# Patient Record
Sex: Male | Born: 1980 | Race: White | Hispanic: Yes | Marital: Single | Smoking: Never smoker
Health system: Southern US, Community
[De-identification: ages and names within clinical notes are randomized; demographics above are authoritative.]

## PROBLEM LIST (undated history)

## (undated) DIAGNOSIS — E119 Type 2 diabetes mellitus without complications: Secondary | ICD-10-CM

## (undated) DIAGNOSIS — H9190 Unspecified hearing loss, unspecified ear: Secondary | ICD-10-CM

## (undated) DIAGNOSIS — K589 Irritable bowel syndrome without diarrhea: Secondary | ICD-10-CM

## (undated) HISTORY — DX: Type 2 diabetes mellitus without complications: E11.9

## (undated) HISTORY — DX: Unspecified hearing loss, unspecified ear: H91.90

## (undated) HISTORY — DX: Irritable bowel syndrome, unspecified: K58.9

---

## 2008-05-19 DIAGNOSIS — H93019 Transient ischemic deafness, unspecified ear: Secondary | ICD-10-CM | POA: Insufficient documentation

## 2011-05-04 DIAGNOSIS — J029 Acute pharyngitis, unspecified: Secondary | ICD-10-CM | POA: Diagnosis not present

## 2011-07-13 DIAGNOSIS — R5381 Other malaise: Secondary | ICD-10-CM | POA: Diagnosis not present

## 2011-07-13 DIAGNOSIS — R5383 Other fatigue: Secondary | ICD-10-CM | POA: Diagnosis not present

## 2011-07-13 DIAGNOSIS — J029 Acute pharyngitis, unspecified: Secondary | ICD-10-CM | POA: Diagnosis not present

## 2011-07-15 DIAGNOSIS — R5383 Other fatigue: Secondary | ICD-10-CM | POA: Diagnosis not present

## 2011-07-15 DIAGNOSIS — Z6828 Body mass index (BMI) 28.0-28.9, adult: Secondary | ICD-10-CM | POA: Diagnosis not present

## 2011-07-15 DIAGNOSIS — K589 Irritable bowel syndrome without diarrhea: Secondary | ICD-10-CM | POA: Diagnosis not present

## 2011-07-15 DIAGNOSIS — R5381 Other malaise: Secondary | ICD-10-CM | POA: Diagnosis not present

## 2011-07-15 DIAGNOSIS — F489 Nonpsychotic mental disorder, unspecified: Secondary | ICD-10-CM | POA: Diagnosis not present

## 2011-07-18 DIAGNOSIS — Z6829 Body mass index (BMI) 29.0-29.9, adult: Secondary | ICD-10-CM | POA: Diagnosis not present

## 2011-07-18 DIAGNOSIS — R1115 Cyclical vomiting syndrome unrelated to migraine: Secondary | ICD-10-CM | POA: Diagnosis not present

## 2011-08-24 DIAGNOSIS — K649 Unspecified hemorrhoids: Secondary | ICD-10-CM | POA: Diagnosis not present

## 2011-09-30 DIAGNOSIS — K589 Irritable bowel syndrome without diarrhea: Secondary | ICD-10-CM | POA: Diagnosis not present

## 2012-04-21 DIAGNOSIS — R109 Unspecified abdominal pain: Secondary | ICD-10-CM | POA: Diagnosis not present

## 2012-04-21 DIAGNOSIS — Z79899 Other long term (current) drug therapy: Secondary | ICD-10-CM | POA: Diagnosis not present

## 2012-04-21 DIAGNOSIS — H9190 Unspecified hearing loss, unspecified ear: Secondary | ICD-10-CM | POA: Diagnosis not present

## 2012-04-21 DIAGNOSIS — R5383 Other fatigue: Secondary | ICD-10-CM | POA: Diagnosis not present

## 2012-04-21 DIAGNOSIS — K625 Hemorrhage of anus and rectum: Secondary | ICD-10-CM | POA: Diagnosis not present

## 2012-04-21 DIAGNOSIS — K589 Irritable bowel syndrome without diarrhea: Secondary | ICD-10-CM | POA: Diagnosis not present

## 2012-04-21 DIAGNOSIS — R197 Diarrhea, unspecified: Secondary | ICD-10-CM | POA: Diagnosis not present

## 2012-04-21 DIAGNOSIS — F329 Major depressive disorder, single episode, unspecified: Secondary | ICD-10-CM | POA: Diagnosis not present

## 2012-04-21 DIAGNOSIS — R42 Dizziness and giddiness: Secondary | ICD-10-CM | POA: Diagnosis not present

## 2012-04-21 DIAGNOSIS — R5381 Other malaise: Secondary | ICD-10-CM | POA: Diagnosis not present

## 2012-04-21 DIAGNOSIS — K219 Gastro-esophageal reflux disease without esophagitis: Secondary | ICD-10-CM | POA: Diagnosis not present

## 2012-04-27 DIAGNOSIS — K625 Hemorrhage of anus and rectum: Secondary | ICD-10-CM | POA: Diagnosis not present

## 2012-04-27 DIAGNOSIS — Z6830 Body mass index (BMI) 30.0-30.9, adult: Secondary | ICD-10-CM | POA: Diagnosis not present

## 2012-04-27 DIAGNOSIS — K589 Irritable bowel syndrome without diarrhea: Secondary | ICD-10-CM | POA: Diagnosis not present

## 2012-04-27 DIAGNOSIS — F489 Nonpsychotic mental disorder, unspecified: Secondary | ICD-10-CM | POA: Diagnosis not present

## 2012-07-10 DIAGNOSIS — R197 Diarrhea, unspecified: Secondary | ICD-10-CM | POA: Diagnosis not present

## 2012-07-16 DIAGNOSIS — K589 Irritable bowel syndrome without diarrhea: Secondary | ICD-10-CM | POA: Diagnosis not present

## 2012-08-21 DIAGNOSIS — R197 Diarrhea, unspecified: Secondary | ICD-10-CM | POA: Diagnosis not present

## 2012-08-21 DIAGNOSIS — K648 Other hemorrhoids: Secondary | ICD-10-CM | POA: Diagnosis not present

## 2012-08-21 DIAGNOSIS — K921 Melena: Secondary | ICD-10-CM | POA: Diagnosis not present

## 2012-08-21 DIAGNOSIS — K6289 Other specified diseases of anus and rectum: Secondary | ICD-10-CM | POA: Diagnosis not present

## 2012-08-21 DIAGNOSIS — K649 Unspecified hemorrhoids: Secondary | ICD-10-CM | POA: Diagnosis not present

## 2012-10-29 DIAGNOSIS — E038 Other specified hypothyroidism: Secondary | ICD-10-CM | POA: Diagnosis not present

## 2012-10-29 DIAGNOSIS — F489 Nonpsychotic mental disorder, unspecified: Secondary | ICD-10-CM | POA: Diagnosis not present

## 2012-10-29 DIAGNOSIS — R1013 Epigastric pain: Secondary | ICD-10-CM | POA: Diagnosis not present

## 2012-11-01 DIAGNOSIS — R1011 Right upper quadrant pain: Secondary | ICD-10-CM | POA: Diagnosis not present

## 2012-11-02 DIAGNOSIS — Z6830 Body mass index (BMI) 30.0-30.9, adult: Secondary | ICD-10-CM | POA: Diagnosis not present

## 2012-11-02 DIAGNOSIS — F489 Nonpsychotic mental disorder, unspecified: Secondary | ICD-10-CM | POA: Diagnosis not present

## 2012-11-02 DIAGNOSIS — K589 Irritable bowel syndrome without diarrhea: Secondary | ICD-10-CM | POA: Diagnosis not present

## 2014-01-24 DIAGNOSIS — K6289 Other specified diseases of anus and rectum: Secondary | ICD-10-CM | POA: Diagnosis not present

## 2014-01-24 DIAGNOSIS — K760 Fatty (change of) liver, not elsewhere classified: Secondary | ICD-10-CM | POA: Diagnosis not present

## 2014-01-24 DIAGNOSIS — R1032 Left lower quadrant pain: Secondary | ICD-10-CM | POA: Diagnosis not present

## 2014-01-24 DIAGNOSIS — R109 Unspecified abdominal pain: Secondary | ICD-10-CM | POA: Diagnosis not present

## 2014-01-24 DIAGNOSIS — R197 Diarrhea, unspecified: Secondary | ICD-10-CM | POA: Diagnosis not present

## 2014-01-24 DIAGNOSIS — R112 Nausea with vomiting, unspecified: Secondary | ICD-10-CM | POA: Diagnosis not present

## 2014-04-14 DIAGNOSIS — K589 Irritable bowel syndrome without diarrhea: Secondary | ICD-10-CM | POA: Diagnosis not present

## 2014-09-15 DIAGNOSIS — J029 Acute pharyngitis, unspecified: Secondary | ICD-10-CM | POA: Diagnosis not present

## 2014-09-15 DIAGNOSIS — J020 Streptococcal pharyngitis: Secondary | ICD-10-CM | POA: Diagnosis not present

## 2014-09-15 DIAGNOSIS — K589 Irritable bowel syndrome without diarrhea: Secondary | ICD-10-CM | POA: Diagnosis not present

## 2014-09-15 DIAGNOSIS — K210 Gastro-esophageal reflux disease with esophagitis: Secondary | ICD-10-CM | POA: Diagnosis not present

## 2014-09-15 DIAGNOSIS — Z6831 Body mass index (BMI) 31.0-31.9, adult: Secondary | ICD-10-CM | POA: Diagnosis not present

## 2014-10-19 DIAGNOSIS — K760 Fatty (change of) liver, not elsewhere classified: Secondary | ICD-10-CM | POA: Diagnosis not present

## 2014-10-19 DIAGNOSIS — K6289 Other specified diseases of anus and rectum: Secondary | ICD-10-CM | POA: Diagnosis not present

## 2014-10-19 DIAGNOSIS — K625 Hemorrhage of anus and rectum: Secondary | ICD-10-CM | POA: Diagnosis not present

## 2014-10-19 DIAGNOSIS — R7989 Other specified abnormal findings of blood chemistry: Secondary | ICD-10-CM | POA: Diagnosis not present

## 2014-10-19 DIAGNOSIS — R945 Abnormal results of liver function studies: Secondary | ICD-10-CM | POA: Diagnosis not present

## 2014-10-19 DIAGNOSIS — K922 Gastrointestinal hemorrhage, unspecified: Secondary | ICD-10-CM | POA: Diagnosis not present

## 2015-07-09 DIAGNOSIS — K649 Unspecified hemorrhoids: Secondary | ICD-10-CM | POA: Diagnosis not present

## 2015-07-09 DIAGNOSIS — R1032 Left lower quadrant pain: Secondary | ICD-10-CM | POA: Diagnosis not present

## 2015-07-10 DIAGNOSIS — R1032 Left lower quadrant pain: Secondary | ICD-10-CM | POA: Diagnosis not present

## 2015-07-12 DIAGNOSIS — K649 Unspecified hemorrhoids: Secondary | ICD-10-CM | POA: Diagnosis not present

## 2016-07-11 DIAGNOSIS — R05 Cough: Secondary | ICD-10-CM | POA: Diagnosis not present

## 2016-07-11 DIAGNOSIS — G8929 Other chronic pain: Secondary | ICD-10-CM | POA: Diagnosis not present

## 2016-07-11 DIAGNOSIS — R509 Fever, unspecified: Secondary | ICD-10-CM | POA: Diagnosis not present

## 2016-07-11 DIAGNOSIS — R1084 Generalized abdominal pain: Secondary | ICD-10-CM | POA: Diagnosis not present

## 2016-07-12 DIAGNOSIS — R509 Fever, unspecified: Secondary | ICD-10-CM | POA: Diagnosis not present

## 2016-07-12 DIAGNOSIS — R05 Cough: Secondary | ICD-10-CM | POA: Diagnosis not present

## 2016-07-13 DIAGNOSIS — Z6830 Body mass index (BMI) 30.0-30.9, adult: Secondary | ICD-10-CM | POA: Diagnosis not present

## 2016-07-13 DIAGNOSIS — R300 Dysuria: Secondary | ICD-10-CM | POA: Diagnosis not present

## 2016-07-13 DIAGNOSIS — N419 Inflammatory disease of prostate, unspecified: Secondary | ICD-10-CM | POA: Diagnosis not present

## 2016-07-13 DIAGNOSIS — Z114 Encounter for screening for human immunodeficiency virus [HIV]: Secondary | ICD-10-CM | POA: Diagnosis not present

## 2016-07-13 DIAGNOSIS — E669 Obesity, unspecified: Secondary | ICD-10-CM | POA: Diagnosis not present

## 2016-07-13 DIAGNOSIS — K649 Unspecified hemorrhoids: Secondary | ICD-10-CM | POA: Diagnosis not present

## 2016-07-13 DIAGNOSIS — K589 Irritable bowel syndrome without diarrhea: Secondary | ICD-10-CM | POA: Diagnosis not present

## 2017-03-25 DIAGNOSIS — Z79899 Other long term (current) drug therapy: Secondary | ICD-10-CM | POA: Diagnosis not present

## 2017-03-25 DIAGNOSIS — H913 Deaf nonspeaking, not elsewhere classified: Secondary | ICD-10-CM | POA: Diagnosis not present

## 2017-03-25 DIAGNOSIS — N342 Other urethritis: Secondary | ICD-10-CM | POA: Diagnosis not present

## 2017-05-08 ENCOUNTER — Telehealth: Payer: Self-pay | Admitting: General Practice

## 2017-05-08 NOTE — Telephone Encounter (Signed)
LVM to inform Dr. Yetta BarreJones will accept him as a patient.  When patient calls back please set them up as a New patient with Dr.Jones in the comment and OV as the type.

## 2017-05-08 NOTE — Telephone Encounter (Signed)
Yes, I will see him.

## 2017-05-08 NOTE — Telephone Encounter (Signed)
Copied from CRM [phone]. Topic: Inquiry >> May 08, 2017 11:32 AM Stephannie LiSimmons, Janett L, NT wrote: Reason for CRM: Henreitta CeaJimmy Miller said he spoken with  Dr Yetta BarreJones in regards to accepting Lauris ChromanDazey Alwyn as a new patient , please advise [phone]  [phone]     Dr. Yetta BarreJones are you ok with this?

## 2017-05-21 DIAGNOSIS — J181 Lobar pneumonia, unspecified organism: Secondary | ICD-10-CM | POA: Diagnosis not present

## 2017-05-21 DIAGNOSIS — R05 Cough: Secondary | ICD-10-CM | POA: Diagnosis not present

## 2017-05-21 DIAGNOSIS — J09X2 Influenza due to identified novel influenza A virus with other respiratory manifestations: Secondary | ICD-10-CM | POA: Diagnosis not present

## 2017-05-22 ENCOUNTER — Ambulatory Visit (INDEPENDENT_AMBULATORY_CARE_PROVIDER_SITE_OTHER): Payer: Medicare Other | Admitting: Internal Medicine

## 2017-05-22 ENCOUNTER — Encounter: Payer: Self-pay | Admitting: Internal Medicine

## 2017-05-22 VITALS — BP 110/70 | HR 72 | Temp 99.7°F | Resp 16 | Ht 70.0 in | Wt 206.0 lb

## 2017-05-22 DIAGNOSIS — J111 Influenza due to unidentified influenza virus with other respiratory manifestations: Secondary | ICD-10-CM

## 2017-05-22 DIAGNOSIS — N478 Other disorders of prepuce: Secondary | ICD-10-CM

## 2017-05-22 MED ORDER — PROMETHAZINE HCL 12.5 MG PO TABS: 12.5000 mg | ORAL_TABLET | Freq: Four times a day (QID) | ORAL | 0 refills | Status: DC | PRN

## 2017-05-22 MED ORDER — HYDROCODONE-HOMATROPINE 5-1.5 MG/5ML PO SYRP: 5.0000 mL | ORAL_SOLUTION | Freq: Three times a day (TID) | ORAL | 0 refills | Status: DC | PRN

## 2017-05-22 NOTE — Progress Notes (Signed)
Subjective:  Patient ID: Derrick Benitez, male    DOB: 05/24/1980  Age: 37 y.o. 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  HPI Derrick Benitez presents for a 3-day history of upper respiratory symptoms that include sore throat, low-grade fever, chills, muscle aches, cough productive of green phlegm, intermittent nausea, vomiting, and diarrhea.  One day prior to this visit he was seen at an outside ED and he tells me that he had a flu test done and a chest x-ray performed and he does not know the results and no results are available to me today.  He is being treated with Tamiflu and Z-Pak but has persistent symptoms.  History Derrick PilgrimJacob has no past medical history on file.   He has no past surgical history on file.   His family history is not on file.He reports that  has never smoked. he has never used smokeless tobacco. He reports that he drinks about 1.8 oz of alcohol per week. He reports that he does not use drugs.  Outpatient Medications Prior to Visit  Medication Sig Dispense Refill  . azithromycin (ZITHROMAX) 250 MG tablet ZPK  0  . oseltamivir (TAMIFLU) 75 MG capsule     . ondansetron (ZOFRAN) 4 MG tablet TK 1 T PO TID  0   No facility-administered medications prior to visit.     ROS Review of Systems  Constitutional: Positive for chills, fatigue and fever.  HENT: Positive for congestion, postnasal drip, rhinorrhea and sore throat. Negative for facial swelling, sinus pain and trouble swallowing.   Eyes: Negative.   Respiratory: Positive for cough. Negative for chest tightness, shortness of breath and wheezing.   Cardiovascular: Negative for chest pain, palpitations and leg swelling.  Gastrointestinal: Negative for abdominal pain, constipation, diarrhea, nausea and vomiting.  Endocrine: Negative.   Genitourinary: Negative.  Negative for difficulty urinating.       He complains of foreskin irritation and wants to see a  urologist.  Musculoskeletal: Positive for myalgias.  Skin: Negative.  Negative for color change and rash.  Neurological: Negative.  Negative for dizziness.  Hematological: Negative for adenopathy. Does not bruise/bleed easily.  Psychiatric/Behavioral: Negative.     Objective:  BP 110/70 (BP Location: Left Arm, Patient Position: Sitting, Cuff Size: Normal)   Pulse 72   Temp 99.7 F (37.6 C) (Oral)   Resp 16   Ht 5\' 10"  (1.778 m)   Wt 206 lb (93.4 kg)   SpO2 98%   BMI 29.56 kg/m   Physical Exam  Constitutional: He is oriented to person, place, and time. No distress.  HENT:  Mouth/Throat: Mucous membranes are normal. Mucous membranes are not pale, not dry and not cyanotic. No oral lesions. No trismus in the jaw. Posterior oropharyngeal erythema present. No oropharyngeal exudate, posterior oropharyngeal edema or tonsillar abscesses.  Eyes: Left eye exhibits no discharge. No scleral icterus.  Neck: Normal range of motion. Neck supple. No JVD present. No thyromegaly present.  Cardiovascular: Normal rate, regular rhythm and normal heart sounds. Exam reveals no gallop.  No murmur heard. Pulmonary/Chest: Effort normal and breath sounds normal. No respiratory distress. He has no wheezes. He has no rales.  Abdominal: Soft. Bowel sounds are normal. He exhibits no distension and no mass. There is no tenderness.  Genitourinary:  Genitourinary Comments: GU exam deferred at his request.  Musculoskeletal: Normal range of motion. He exhibits no edema or tenderness.  Lymphadenopathy:  He has no cervical adenopathy.  Neurological: He is alert and oriented to person, place, and time.  Skin: Skin is warm and dry. No rash noted. He is not diaphoretic. No erythema. No pallor.  Vitals reviewed.   No results found for: WBC, HGB, HCT, PLT, GLUCOSE, CHOL, TRIG, HDL, LDLDIRECT, LDLCALC, ALT, AST, NA, K, CL, CREATININE, BUN, CO2, TSH, PSA, INR, GLUF, HGBA1C, MICROALBUR  Assessment & Plan:   Derrick Benitez was  seen today for uri.  Diagnoses and all orders for this visit:  Influenza- His symptoms and exam are consistent with influenza with a possible secondary bacterial infection.  I have asked him to complete the course of Tamiflu and Zithromax.  Will also offer symptom relief with promethazine and Hycodan as needed. -     promethazine (PHENERGAN) 12.5 MG tablet; Take 1 tablet (12.5 mg total) by mouth every 6 (six) hours as needed for nausea or vomiting. -     HYDROcodone-homatropine (HYCODAN) 5-1.5 MG/5ML syrup; Take 5 mLs by mouth every 8 (eight) hours as needed for cough.  Redundant foreskin -     Ambulatory referral to Urology   I have discontinued Derrick Benitez's ondansetron. I am also having him start on promethazine and HYDROcodone-homatropine. Additionally, I am having him maintain his oseltamivir and azithromycin.  Meds ordered this encounter  Medications  . promethazine (PHENERGAN) 12.5 MG tablet    Sig: Take 1 tablet (12.5 mg total) by mouth every 6 (six) hours as needed for nausea or vomiting.    Dispense:  30 tablet    Refill:  0  . HYDROcodone-homatropine (HYCODAN) 5-1.5 MG/5ML syrup    Sig: Take 5 mLs by mouth every 8 (eight) hours as needed for cough.    Dispense:  120 mL    Refill:  0     Follow-up: Return in about 3 weeks (around 06/12/2017).  Derrick Linger, MD

## 2017-05-22 NOTE — Patient Instructions (Signed)

## 2017-05-23 ENCOUNTER — Encounter: Payer: Self-pay | Admitting: Internal Medicine

## 2017-05-24 ENCOUNTER — Telehealth: Payer: Self-pay | Admitting: Internal Medicine

## 2017-05-24 NOTE — Telephone Encounter (Signed)
Copied from CRM [phone]. Topic: Quick Communication - See Telephone Encounter >> May 24, 2017  3:29 PM Guinevere FerrariMorris, Theodis Kinsel E, NT wrote: CRM for notification. See Telephone encounter for: Patient called and said he was taking oseltamivir (TAMIFLU) 75 MG capsule and is not any better. Pt wanted to see if the nurse or doctor could give him a call back.   05/24/17.

## 2017-05-24 NOTE — Telephone Encounter (Signed)
Called pt and informed that we would not be able to refill the tamiflu. Instructed pt to finish out the rx and that it will take time before he feels better.   Pt stated understanding.

## 2017-06-12 ENCOUNTER — Encounter: Payer: Self-pay | Admitting: Internal Medicine

## 2017-06-12 ENCOUNTER — Ambulatory Visit (INDEPENDENT_AMBULATORY_CARE_PROVIDER_SITE_OTHER): Payer: Medicare Other | Admitting: Internal Medicine

## 2017-06-12 ENCOUNTER — Other Ambulatory Visit (INDEPENDENT_AMBULATORY_CARE_PROVIDER_SITE_OTHER): Payer: Medicare Other

## 2017-06-12 VITALS — BP 120/80 | HR 61 | Temp 98.1°F | Resp 16 | Ht 70.0 in | Wt 197.0 lb

## 2017-06-12 DIAGNOSIS — Z7251 High risk heterosexual behavior: Secondary | ICD-10-CM

## 2017-06-12 DIAGNOSIS — Z23 Encounter for immunization: Secondary | ICD-10-CM | POA: Diagnosis not present

## 2017-06-12 DIAGNOSIS — E785 Hyperlipidemia, unspecified: Secondary | ICD-10-CM | POA: Diagnosis not present

## 2017-06-12 DIAGNOSIS — R768 Other specified abnormal immunological findings in serum: Secondary | ICD-10-CM

## 2017-06-12 DIAGNOSIS — Z206 Contact with and (suspected) exposure to human immunodeficiency virus [HIV]: Secondary | ICD-10-CM | POA: Insufficient documentation

## 2017-06-12 DIAGNOSIS — B349 Viral infection, unspecified: Secondary | ICD-10-CM | POA: Diagnosis not present

## 2017-06-12 DIAGNOSIS — R21 Rash and other nonspecific skin eruption: Secondary | ICD-10-CM | POA: Insufficient documentation

## 2017-06-12 DIAGNOSIS — R300 Dysuria: Secondary | ICD-10-CM

## 2017-06-12 DIAGNOSIS — R509 Fever, unspecified: Secondary | ICD-10-CM

## 2017-06-12 DIAGNOSIS — R059 Cough, unspecified: Secondary | ICD-10-CM | POA: Insufficient documentation

## 2017-06-12 DIAGNOSIS — R05 Cough: Secondary | ICD-10-CM | POA: Insufficient documentation

## 2017-06-12 LAB — CBC WITH DIFFERENTIAL/PLATELET
Basophils Absolute: 0.1 10*3/uL (ref 0.0–0.1)
Basophils Relative: 1.1 % (ref 0.0–3.0)
Eosinophils Absolute: 0.1 10*3/uL (ref 0.0–0.7)
Eosinophils Relative: 1.1 % (ref 0.0–5.0)
HCT: 45.7 % (ref 39.0–52.0)
Hemoglobin: 15.7 g/dL (ref 13.0–17.0)
LYMPHS ABS: 2.1 10*3/uL (ref 0.7–4.0)
Lymphocytes Relative: 37 % (ref 12.0–46.0)
MCHC: 34.3 g/dL (ref 30.0–36.0)
MCV: 86 fl (ref 78.0–100.0)
MONO ABS: 0.5 10*3/uL (ref 0.1–1.0)
MONOS PCT: 9.7 % (ref 3.0–12.0)
NEUTROS ABS: 2.9 10*3/uL (ref 1.4–7.7)
NEUTROS PCT: 51.1 % (ref 43.0–77.0)
PLATELETS: 280 10*3/uL (ref 150.0–400.0)
RBC: 5.32 Mil/uL (ref 4.22–5.81)
RDW: 13.6 % (ref 11.5–15.5)
WBC: 5.6 10*3/uL (ref 4.0–10.5)

## 2017-06-12 LAB — COMPREHENSIVE METABOLIC PANEL
ALT: 53 U/L (ref 0–53)
AST: 26 U/L (ref 0–37)
Albumin: 4.2 g/dL (ref 3.5–5.2)
Alkaline Phosphatase: 63 U/L (ref 39–117)
BUN: 17 mg/dL (ref 6–23)
CO2: 29 mEq/L (ref 19–32)
Calcium: 9.7 mg/dL (ref 8.4–10.5)
Chloride: 105 mEq/L (ref 96–112)
Creatinine, Ser: 0.95 mg/dL (ref 0.40–1.50)
GFR: 94.87 mL/min (ref >60.00)
Glucose, Bld: 104 mg/dL — ABNORMAL HIGH (ref 70–99)
Potassium: 4.1 mEq/L (ref 3.5–5.1)
Sodium: 140 mEq/L (ref 135–145)
Total Bilirubin: 0.7 mg/dL (ref 0.2–1.2)
Total Protein: 8.1 g/dL (ref 6.0–8.3)

## 2017-06-12 LAB — URINALYSIS, ROUTINE W REFLEX MICROSCOPIC
Bilirubin Urine: NEGATIVE
Ketones, ur: NEGATIVE
Leukocytes, UA: NEGATIVE
NITRITE: NEGATIVE
Total Protein, Urine: NEGATIVE
Urine Glucose: NEGATIVE
Urobilinogen, UA: 0.2 (ref 0.0–1.0)
pH: 5.5 (ref 5.0–8.0)

## 2017-06-12 NOTE — Patient Instructions (Signed)

## 2017-06-12 NOTE — Progress Notes (Signed)
Subjective:  Patient ID: Derrick Benitez, male    DOB: 09/28/1980  Age: 37 y.o. MRN: [id number]  CC: Cough   HPI Derrick Benitez presents for f/up - He was recently seen for a flulike illness and he tells me he is doing much better.  He has a mild, improving, nonproductive cough.  He denies sore throat, fever, chills, rash, or lymphadenopathy.  He considers his sexual practices to be high risk and he wants to consider starting PrEP.  History Derrick Benitez has no past medical history on file.   He has no past surgical history on file.   His family history is not on file.He reports that  has never smoked. he has never used smokeless tobacco. He reports that he drinks about 1.8 oz of alcohol per week. He reports that he does not use drugs.  Outpatient Medications Prior to Visit  Medication Sig Dispense Refill  . azithromycin (ZITHROMAX) 250 MG tablet ZPK  0  . HYDROcodone-homatropine (HYCODAN) 5-1.5 MG/5ML syrup Take 5 mLs by mouth every 8 (eight) hours as needed for cough. 120 mL 0  . oseltamivir (TAMIFLU) 75 MG capsule     . promethazine (PHENERGAN) 12.5 MG tablet Take 1 tablet (12.5 mg total) by mouth every 6 (six) hours as needed for nausea or vomiting. 30 tablet 0   No facility-administered medications prior to visit.     ROS Review of Systems  Constitutional: Negative.  Negative for chills, diaphoresis, fatigue and fever.  HENT: Negative.  Negative for sore throat and trouble swallowing.   Eyes: Negative for visual disturbance.  Respiratory: Positive for cough. Negative for chest tightness, shortness of breath and wheezing.   Cardiovascular: Negative for chest pain, palpitations and leg swelling.  Gastrointestinal: Negative for abdominal pain, diarrhea, nausea and vomiting.  Endocrine: Negative.   Genitourinary: Positive for dysuria. Negative for difficulty urinating, genital sores, penile pain, penile swelling, scrotal swelling, testicular pain and urgency.  Musculoskeletal: Negative.   Negative for back pain and myalgias.  Skin: Negative.  Negative for rash.  Allergic/Immunologic: Negative.   Neurological: Negative.  Negative for dizziness, weakness, light-headedness and headaches.  Hematological: Negative.  Negative for adenopathy. Does not bruise/bleed easily.  Psychiatric/Behavioral: Negative for sleep disturbance. The patient is not nervous/anxious.     Objective:  BP 120/80 (BP Location: Left Arm, Patient Position: Sitting, Cuff Size: Large)   Pulse 61   Temp 98.1 F (36.7 C) (Oral)   Resp 16   Ht 5\' 10"  (1.778 m)   Wt 197 lb (89.4 kg)   SpO2 97%   BMI 28.27 kg/m   Physical Exam  Constitutional: He is oriented to person, place, and time. No distress.  HENT:  Mouth/Throat: Oropharynx is clear and moist. No oropharyngeal exudate.  Eyes: Conjunctivae are normal. Left eye exhibits no discharge. No scleral icterus.  Neck: Normal range of motion. Neck supple. No JVD present. No thyromegaly present.  Cardiovascular: Normal rate, regular rhythm and normal heart sounds. Exam reveals no gallop.  No murmur heard. Pulmonary/Chest: Effort normal and breath sounds normal. No respiratory distress. He has no wheezes. He has no rales.  Abdominal: Soft. Bowel sounds are normal. He exhibits no distension and no mass. There is no tenderness. There is no guarding. Hernia confirmed negative in the right inguinal area and confirmed negative in the left inguinal area.  Genitourinary: Testes normal. Right testis shows no mass, no swelling and no tenderness. Left testis shows no mass, no swelling and no tenderness. Uncircumcised. No phimosis, paraphimosis,  hypospadias, penile erythema or penile tenderness. No discharge found.  Musculoskeletal: Normal range of motion. He exhibits no edema, tenderness or deformity.  Lymphadenopathy:    He has no cervical adenopathy.       Right: No inguinal adenopathy present.       Left: No inguinal adenopathy present.  Neurological: He is alert and  oriented to person, place, and time.  Skin: Skin is warm and dry. No rash noted. He is not diaphoretic. No erythema. No pallor.  Psychiatric: He has a normal mood and affect. His behavior is normal. Judgment and thought content normal.  Vitals reviewed.   Lab Results  Component Value Date   WBC 5.6 06/12/2017   HGB 15.7 06/12/2017   HCT 45.7 06/12/2017   PLT 280.0 06/12/2017   GLUCOSE 104 (H) 06/12/2017   ALT 53 06/12/2017   AST 26 06/12/2017   NA 140 06/12/2017   K 4.1 06/12/2017   CL 105 06/12/2017   CREATININE 0.95 06/12/2017   BUN 17 06/12/2017   CO2 29 06/12/2017    Assessment & Plan:   [name] was seen today for cough.  Diagnoses and all orders for this visit:  Need for Tdap vaccination -     Tdap vaccine greater than or equal to 7yo IM  Dysuria- His exam is normal.  Urinalysis is normal and screening for gonorrhea, chlamydia, and syphilis is negative. -     Urinalysis, Routine w reflex microscopic; Future -     GC/chlamydia probe amp, genital; Future -     RPR; Future  Hyperlipidemia LDL goal <160 -     Comprehensive metabolic panel; Future -     CBC with Differential/Platelet; Future  High risk heterosexual behavior- He was encouraged to practice safe sexual activities.  Despite entering several diagnoses I cannot get his insurer to approve an HIV test.  So, unfortunately, at this time I cannot start PrEP because I do not know his HIV status.  I have asked him to seek other opportunities such as the health department to be screened for HIV.  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  I have asked him to come in to be vaccinated against hepatitis A. -     Comprehensive metabolic panel; Future -     Hepatitis B surface antigen; Future -     Hepatitis A antibody, total; Future -     Hepatitis B core antibody, total; Future -     Hepatitis B surface antibody; Future  Fever, unspecified fever cause  Need for influenza vaccination -     Flu Vaccine QUAD 6+  mos PF IM (Fluarix Quad PF)  Acute viral syndrome- This has resolved   I have discontinued [name]'s oseltamivir, azithromycin, promethazine, and HYDROcodone-homatropine.  No orders of the defined types were placed in this encounter.    Follow-up: Return in about 3 months (around 09/12/2017).  Sanda Linger, MD

## 2017-06-13 DIAGNOSIS — R7689 Other specified abnormal immunological findings in serum: Secondary | ICD-10-CM | POA: Insufficient documentation

## 2017-06-13 DIAGNOSIS — R768 Other specified abnormal immunological findings in serum: Secondary | ICD-10-CM | POA: Insufficient documentation

## 2017-06-13 LAB — HEPATITIS B SURFACE ANTIBODY,QUALITATIVE: Hep B S Ab: REACTIVE — AB

## 2017-06-13 LAB — HEPATITIS B SURFACE ANTIGEN: HEP B S AG: NONREACTIVE

## 2017-06-13 LAB — RPR: RPR Ser Ql: NONREACTIVE

## 2017-06-13 LAB — HEPATITIS A ANTIBODY, TOTAL: HEPATITIS A AB,TOTAL: NONREACTIVE

## 2017-06-13 LAB — HEPATITIS B CORE ANTIBODY, TOTAL: HEP B C TOTAL AB: NONREACTIVE

## 2017-06-14 ENCOUNTER — Encounter: Payer: Self-pay | Admitting: Internal Medicine

## 2017-06-14 LAB — GC/CHLAMYDIA PROBE AMP
CHLAMYDIA, DNA PROBE: NEGATIVE
NEISSERIA GONORRHOEAE BY PCR: NEGATIVE

## 2017-06-19 ENCOUNTER — Ambulatory Visit (INDEPENDENT_AMBULATORY_CARE_PROVIDER_SITE_OTHER): Payer: Medicare Other | Admitting: *Deleted

## 2017-06-19 DIAGNOSIS — Z23 Encounter for immunization: Secondary | ICD-10-CM

## 2017-07-17 DIAGNOSIS — R300 Dysuria: Secondary | ICD-10-CM | POA: Diagnosis not present

## 2017-11-02 ENCOUNTER — Other Ambulatory Visit: Payer: Self-pay | Admitting: Internal Medicine

## 2017-11-02 ENCOUNTER — Encounter: Payer: Self-pay | Admitting: Internal Medicine

## 2017-11-02 ENCOUNTER — Ambulatory Visit (INDEPENDENT_AMBULATORY_CARE_PROVIDER_SITE_OTHER)
Admission: RE | Admit: 2017-11-02 | Discharge: 2017-11-02 | Disposition: A | Discharge status: Home / Self Care | Payer: Medicare Other | Source: Ambulatory Visit | Attending: Internal Medicine | Admitting: Internal Medicine

## 2017-11-02 ENCOUNTER — Ambulatory Visit (INDEPENDENT_AMBULATORY_CARE_PROVIDER_SITE_OTHER): Payer: Medicare Other | Admitting: Internal Medicine

## 2017-11-02 VITALS — BP 140/80 | HR 68 | Temp 98.7°F | Resp 16 | Ht 70.0 in | Wt 222.8 lb

## 2017-11-02 DIAGNOSIS — M79605 Pain in left leg: Secondary | ICD-10-CM

## 2017-11-02 DIAGNOSIS — M545 Low back pain, unspecified: Secondary | ICD-10-CM | POA: Insufficient documentation

## 2017-11-02 MED ORDER — IBUPROFEN 600 MG PO TABS: 600.0000 mg | ORAL_TABLET | Freq: Three times a day (TID) | ORAL | 1 refills | Status: DC | PRN

## 2017-11-02 MED ORDER — IBUPROFEN 600 MG PO TABS: 600.0000 mg | ORAL_TABLET | Freq: Three times a day (TID) | ORAL | 0 refills | Status: DC | PRN

## 2017-11-02 MED ORDER — METHYLPREDNISOLONE 4 MG PO TBPK: ORAL_TABLET | ORAL | 0 refills | Status: AC

## 2017-11-02 NOTE — Patient Instructions (Signed)
Herniated Disk A herniated disk is when a disk in your spine bulges out too far. There is a disk with a spongy center in between each pair of bones in the spine (vertebrae). These disks act as shock absorbers when you move. A herniated disk can cause pain and muscle weakness. This can happen anywhere in the back or neck. Follow these instructions at home: Medicines  Take over-the-counter and prescription medicines only as told by your doctor.  Do not drive or use heavy machinery while taking prescription pain medicine. Activity  Rest as told by your doctor.  After your rest period: ? Return to your normal activities. Slowly start exercising as told by your doctor. Ask what activities are safe for you. ? Use good posture. ? Avoid movements that cause pain. ? Do not lift anything that is heavier than 10 lb (4.5 kg) until your doctor says this is safe. ? Do not sit or stand for a long time without moving. ? Do not sit for a long time without getting up and moving around.  Do exercises (physical therapy) as told.  Try to strengthen your back and belly (abdomen) with exercises like crunches, swimming, or walking. General instructions  Do not use any products that contain nicotine or tobacco, such as cigarettes and e-cigarettes. If you need help quitting, ask your doctor.  Do not wear high-heeled shoes.  Do not sleep on your belly.  If you are overweight, work with your doctor to lose weight safely.  To prevent or treat constipation while you are taking prescription pain medicine, your doctor may recommend that you: ? Drink enough fluid to keep your pee (urine) clear or pale yellow. ? Take over-the-counter or prescription medicines. ? Eat foods that are high in fiber. These include fresh fruits and vegetables, whole grains, and beans. ? Limit foods that are high in fat and processed sugars. These include fried and sweet foods.  Keep all follow-up visits as told by your doctor. This  is important. How is this prevented?  Stay at a healthy weight.  Try to avoid stress.  Stay in shape. Do at least 150 minutes of moderate-intensity exercise each week, such as fast walking or water aerobics.  When lifting objects: ? Keep your feet as far apart as your shoulders (shoulder-width apart) or farther apart. ? Tighten your belly muscles. ? Bend your knees and hips and keep your spine neutral. Lift using the strength of your legs, not your back. Do not lock your knees straight out. ? Always ask for help to lift heavy or awkward objects. Contact a doctor if:  You have back pain or neck pain that does not get better after 6 weeks.  You have very bad pain.  You get any of these problems in any part of your body: ? Tingling. ? Weakness. ? Loss of feeling (numbness). Get help right away if:  You cannot move your arms or legs.  You cannot control when you pee (urinate) or poop (have a bowel movement).  You feel dizzy.  You faint.  You have trouble breathing. This information is not intended to replace advice given to you by your health care provider. Make sure you discuss any questions you have with your health care provider. Document Released: 08/12/2013 Document Revised: 11/25/2015 Document Reviewed: 09/24/2015 Elsevier Interactive Patient Education  2017 [employer]  

## 2017-11-05 NOTE — Progress Notes (Signed)
Subjective:  Patient ID: Derrick Benitez, male    DOB: 02/07/1981  Age: 37 y.o. MRN: [id number]  CC: Back Pain  Sign language interpreter used today  HPI Derrick Benitez presents for a one-month history of intermittent low back pain that occasionally radiates into his left lower extremity.  The pain is exacerbated by standing at work for long periods of time.  The pain increases with movement and bending.  The pain goes away when he lays down flat in the bed.  He has had a few episodes of vague numbness in his left lower extremity and foot.  He denies any recent trauma or injury.  He tells me that a week ago a coworker gave him a dose of ibuprofen and he had a good response to it.  He has not taken anything for the pain since then.  No outpatient medications prior to visit.   No facility-administered medications prior to visit.     ROS Review of Systems  Constitutional: Positive for unexpected weight change (wt gain). Negative for chills and diaphoresis.  HENT: Negative.   Eyes: Negative for visual disturbance.  Respiratory: Negative.  Negative for cough and shortness of breath.   Cardiovascular: Negative for chest pain, palpitations and leg swelling.  Gastrointestinal: Negative for abdominal pain, constipation, diarrhea, nausea and vomiting.  Genitourinary: Negative.  Negative for difficulty urinating.  Musculoskeletal: Positive for back pain. Negative for gait problem and myalgias.  Skin: Negative.  Negative for rash.  Neurological: Positive for numbness. Negative for dizziness, weakness and light-headedness.  Hematological: Negative for adenopathy. Does not bruise/bleed easily.  Psychiatric/Behavioral: Negative.     Objective:  BP 140/80 (BP Location: Left Arm, Patient Position: Sitting, Cuff Size: Large)   Pulse 68   Temp 98.7 F (37.1 C) (Oral)   Resp 16   Ht 5\' 10"  (1.778 m)   Wt 222 lb 12 oz (101 kg)   SpO2 96%   BMI 31.96 kg/m   BP Readings from Last 3 Encounters:    11/02/17 140/80  06/12/17 120/80  05/22/17 110/70    Wt Readings from Last 3 Encounters:  11/02/17 222 lb 12 oz (101 kg)  06/12/17 197 lb (89.4 kg)  05/22/17 206 lb (93.4 kg)    Physical Exam  Constitutional: He is oriented to person, place, and time. No distress.  HENT:  Mouth/Throat: Oropharynx is clear and moist. No oropharyngeal exudate.  Eyes: Conjunctivae are normal. No scleral icterus.  Neck: Normal range of motion. Neck supple. No JVD present. No thyromegaly present.  Cardiovascular: Normal rate, regular rhythm and normal heart sounds. Exam reveals no gallop.  No murmur heard. Pulmonary/Chest: Effort normal and breath sounds normal.  Abdominal: Normal appearance. He exhibits no mass. There is no hepatosplenomegaly. There is no tenderness.  Musculoskeletal: Normal range of motion. He exhibits no edema, tenderness or deformity.       Lumbar back: Normal. He exhibits normal range of motion, no tenderness, no bony tenderness, no swelling, no edema, no deformity, no pain and no spasm.  Lymphadenopathy:    He has no cervical adenopathy.  Neurological: He is alert and oriented to person, place, and time. He has normal strength. He displays no atrophy and no tremor. No cranial nerve deficit or sensory deficit. He exhibits normal muscle tone. He displays a negative Romberg sign. He displays no seizure activity. Coordination and gait normal.  Reflex Scores:      Tricep reflexes are 1+ on the right side and 1+ on the left  side.      Bicep reflexes are 1+ on the right side and 1+ on the left side.      Brachioradialis reflexes are 1+ on the right side and 1+ on the left side.      Patellar reflexes are 2+ on the right side and 2+ on the left side.      Achilles reflexes are 1+ on the right side and 1+ on the left side. + SLR in LLE - SLR in RLE  Skin: Skin is warm and dry. No rash noted. He is not diaphoretic.  Vitals reviewed.   Lab Results  Component Value Date   WBC 5.6  06/12/2017   HGB 15.7 06/12/2017   HCT 45.7 06/12/2017   PLT 280.0 06/12/2017   GLUCOSE 104 (H) 06/12/2017   ALT 53 06/12/2017   AST 26 06/12/2017   NA 140 06/12/2017   K 4.1 06/12/2017   CL 105 06/12/2017   CREATININE 0.95 06/12/2017   BUN 17 06/12/2017   CO2 29 06/12/2017    Dg Lumbar Spine Complete  Result Date: 11/02/2017 CLINICAL DATA:  37 year old male with 1 month of low back pain radiating to the left leg. No known injury. EXAM: LUMBAR SPINE - COMPLETE 4+ VIEW COMPARISON:  CT Abdomen and Pelvis 07/12/2016. FINDINGS: Normal lumbar segmentation. Increase straightening of lumbar lordosis compared to 2018 but otherwise normal vertebral height and alignment. Relatively preserved disc spaces throughout. No pars fracture. The visible lower thoracic levels appear intact. Sacral ala and SI joints appear normal. Negative abdominal visceral contours. IMPRESSION: Normal for age radiographic appearance of the lumbar spine aside from nonspecific straightening of lordosis. Electronically Signed   By: Odessa Fleming M.D.   On: 11/02/2017 14:42    Assessment & Plan:   Kaine was seen today for back pain.  Diagnoses and all orders for this visit:  Low back pain radiating to left lower extremity- He has low back pain that radiates into his left lower extremity but he is neurologically intact.  Plain films of the low back are unremarkable.  His symptoms are consistent with a mild disc herniation.  Will treat the pain and inflammation with a course of Medrol and will control the pain with ibuprofen as needed. -     DG Lumbar Spine Complete; Future -     Discontinue: ibuprofen (ADVIL,MOTRIN) 600 MG tablet; Take 1 tablet (600 mg total) by mouth every 8 (eight) hours as needed. -     methylPREDNISolone (MEDROL DOSEPAK) 4 MG TBPK tablet; TAKE AS DIRECTED -     ibuprofen (ADVIL,MOTRIN) 600 MG tablet; Take 1 tablet (600 mg total) by mouth every 8 (eight) hours as needed.   I am having Derrick Benitez start on  methylPREDNISolone. I am also having him maintain his ibuprofen.  Meds ordered this encounter  Medications  . DISCONTD: ibuprofen (ADVIL,MOTRIN) 600 MG tablet    Sig: Take 1 tablet (600 mg total) by mouth every 8 (eight) hours as needed.    Dispense:  90 tablet    Refill:  1  . methylPREDNISolone (MEDROL DOSEPAK) 4 MG TBPK tablet    Sig: TAKE AS DIRECTED    Dispense:  21 tablet    Refill:  0  . ibuprofen (ADVIL,MOTRIN) 600 MG tablet    Sig: Take 1 tablet (600 mg total) by mouth every 8 (eight) hours as needed.    Dispense:  180 tablet    Refill:  0     Follow-up: Return in about  1 month (around 11/30/2017).  Sanda Linger, MD

## 2017-11-11 DIAGNOSIS — R0602 Shortness of breath: Secondary | ICD-10-CM | POA: Diagnosis not present

## 2017-11-11 DIAGNOSIS — R1030 Lower abdominal pain, unspecified: Secondary | ICD-10-CM | POA: Diagnosis not present

## 2017-11-11 DIAGNOSIS — K921 Melena: Secondary | ICD-10-CM | POA: Diagnosis not present

## 2017-12-04 ENCOUNTER — Ambulatory Visit (INDEPENDENT_AMBULATORY_CARE_PROVIDER_SITE_OTHER): Payer: Medicare Other | Admitting: Internal Medicine

## 2017-12-04 ENCOUNTER — Other Ambulatory Visit (INDEPENDENT_AMBULATORY_CARE_PROVIDER_SITE_OTHER): Payer: Medicare Other

## 2017-12-04 ENCOUNTER — Encounter: Payer: Self-pay | Admitting: Internal Medicine

## 2017-12-04 VITALS — BP 140/80 | HR 71 | Temp 97.9°F | Resp 16 | Ht 70.0 in | Wt 219.8 lb

## 2017-12-04 DIAGNOSIS — M545 Low back pain, unspecified: Secondary | ICD-10-CM

## 2017-12-04 DIAGNOSIS — Z23 Encounter for immunization: Secondary | ICD-10-CM | POA: Diagnosis not present

## 2017-12-04 DIAGNOSIS — M79605 Pain in left leg: Secondary | ICD-10-CM | POA: Diagnosis not present

## 2017-12-04 LAB — CBC WITH DIFFERENTIAL/PLATELET
BASOS PCT: 1.1 % (ref 0.0–3.0)
Basophils Absolute: 0.1 10*3/uL (ref 0.0–0.1)
EOS ABS: 0.2 10*3/uL (ref 0.0–0.7)
Eosinophils Relative: 2.3 % (ref 0.0–5.0)
HEMATOCRIT: 47.5 % (ref 39.0–52.0)
Hemoglobin: 16.2 g/dL (ref 13.0–17.0)
LYMPHS ABS: 3.3 10*3/uL (ref 0.7–4.0)
Lymphocytes Relative: 41.6 % (ref 12.0–46.0)
MCHC: 34.1 g/dL (ref 30.0–36.0)
MCV: 86.5 fl (ref 78.0–100.0)
MONOS PCT: 10.1 % (ref 3.0–12.0)
Monocytes Absolute: 0.8 10*3/uL (ref 0.1–1.0)
NEUTROS ABS: 3.5 10*3/uL (ref 1.4–7.7)
NEUTROS PCT: 44.9 % (ref 43.0–77.0)
PLATELETS: 258 10*3/uL (ref 150.0–400.0)
RBC: 5.49 Mil/uL (ref 4.22–5.81)
RDW: 13.5 % (ref 11.5–15.5)
WBC: 7.8 10*3/uL (ref 4.0–10.5)

## 2017-12-04 LAB — SEDIMENTATION RATE: Sed Rate: 10 mm/hr (ref 0–15)

## 2017-12-04 MED ORDER — METAXALONE 400 MG PO TABS: 800.0000 mg | ORAL_TABLET | Freq: Three times a day (TID) | ORAL | 1 refills | Status: DC

## 2017-12-04 NOTE — Patient Instructions (Signed)

## 2017-12-04 NOTE — Progress Notes (Signed)
Subjective:  Patient ID: Derrick Benitez, male    DOB: [birth year]  Age: 37 y.o. MRN: [id number]  CC: Back Pain   HPI Derrick Benitez presents for f/up on low back pain.  I saw him about a month ago for low back pain.  His plain films were normal.  He was prescribed ibuprofen and a medrol dose pak.  He tells me this regimen is not helped.  He said a few episodes of chills but he denies fever, nausea, vomiting, or abdominal pain.  He describes the pain as aching and spasm that sometimes radiates towards his left knee.  He is also had numbness and tingling in his left lower extremity but he denies weakness or bowel or bladder incontinence or retention. He is taking Motrin but his Motrin dose is limited by the occasional episode of bloody stool.  Outpatient Medications Prior to Visit  Medication Sig Dispense Refill  . ibuprofen (ADVIL,MOTRIN) 600 MG tablet Take 1 tablet (600 mg total) by mouth every 8 (eight) hours as needed. (Patient not taking: Reported on 12/04/2017) 180 tablet 0   No facility-administered medications prior to visit.     ROS Review of Systems  Constitutional: Positive for chills. Negative for fatigue and fever.  HENT: Negative.   Eyes: Negative for visual disturbance.  Respiratory: Negative for cough, chest tightness, shortness of breath and wheezing.   Gastrointestinal: Positive for blood in stool. Negative for abdominal pain, diarrhea, nausea and rectal pain.  Endocrine: Negative.   Genitourinary: Negative.  Negative for difficulty urinating.  Musculoskeletal: Positive for back pain. Negative for joint swelling and neck pain.  Skin: Negative.  Negative for pallor.  Neurological: Positive for numbness. Negative for dizziness, weakness and headaches.  Hematological: Negative for adenopathy. Does not bruise/bleed easily.  Psychiatric/Behavioral: Negative.     Objective:  BP 140/80 (BP Location: Left Arm, Patient Position: Sitting, Cuff Size: Normal)   Pulse 71   Temp 97.9 F  (36.6 C) (Oral)   Resp 16   Ht 5\' 10"  (1.778 m)   Wt 219 lb 12 oz (99.7 kg)   SpO2 96%   BMI 31.53 kg/m   BP Readings from Last 3 Encounters:  12/04/17 140/80  11/02/17 140/80  06/12/17 120/80    Wt Readings from Last 3 Encounters:  12/04/17 219 lb 12 oz (99.7 kg)  11/02/17 222 lb 12 oz (101 kg)  06/12/17 197 lb (89.4 kg)    Physical Exam  Constitutional: He is oriented to person, place, and time. No distress.  HENT:  Mouth/Throat: Oropharynx is clear and moist. No oropharyngeal exudate.  Eyes: Conjunctivae are normal. No scleral icterus.  Neck: Normal range of motion. Neck supple. No JVD present. No thyromegaly present.  Cardiovascular: Normal rate, regular rhythm and normal heart sounds. Exam reveals no gallop and no friction rub.  No murmur heard. Pulmonary/Chest: Effort normal and breath sounds normal. He has no wheezes. He has no rales.  Abdominal: Soft. Normal appearance and bowel sounds are normal. He exhibits no mass. There is no hepatosplenomegaly. There is no tenderness.  Musculoskeletal: He exhibits no edema or deformity.       Lumbar back: He exhibits decreased range of motion and tenderness. He exhibits no bony tenderness, no edema, no deformity, no pain and no spasm.  Lymphadenopathy:    He has no cervical adenopathy.  Neurological: He is alert and oriented to person, place, and time. He has normal strength. He displays abnormal reflex. He displays no atrophy and no tremor.  He exhibits normal muscle tone. He displays no seizure activity.  Reflex Scores:      Tricep reflexes are 1+ on the right side and 1+ on the left side.      Bicep reflexes are 1+ on the right side and 1+ on the left side.      Brachioradialis reflexes are 1+ on the right side and 1+ on the left side.      Patellar reflexes are 1+ on the right side and 2+ on the left side.      Achilles reflexes are 0 on the right side and 0 on the left side. Neg SLR in BLE  Skin: Skin is warm and dry. No  rash noted. He is not diaphoretic.  Vitals reviewed.   Lab Results  Component Value Date   WBC 7.8 12/04/2017   HGB 16.2 12/04/2017   HCT 47.5 12/04/2017   PLT 258.0 12/04/2017   GLUCOSE 104 (H) 06/12/2017   ALT 53 06/12/2017   AST 26 06/12/2017   NA 140 06/12/2017   K 4.1 06/12/2017   CL 105 06/12/2017   CREATININE 0.95 06/12/2017   BUN 17 06/12/2017   CO2 29 06/12/2017    Dg Lumbar Spine Complete  Result Date: 11/02/2017 CLINICAL DATA:  37 year old male with 1 month of low back pain radiating to the left leg. No known injury. EXAM: LUMBAR SPINE - COMPLETE 4+ VIEW COMPARISON:  CT Abdomen and Pelvis 07/12/2016. FINDINGS: Normal lumbar segmentation. Increase straightening of lumbar lordosis compared to 2018 but otherwise normal vertebral height and alignment. Relatively preserved disc spaces throughout. No pars fracture. The visible lower thoracic levels appear intact. Sacral ala and SI joints appear normal. Negative abdominal visceral contours. IMPRESSION: Normal for age radiographic appearance of the lumbar spine aside from nonspecific straightening of lordosis. Electronically Signed   By: Odessa FlemingH  Hall M.D.   On: 11/02/2017 14:42    Assessment & Plan:   Derrick PilgrimJacob was seen today for back pain.  Diagnoses and all orders for this visit:  Need for influenza vaccination -     Flu Vaccine QUAD 36+ mos IM  Low back pain radiating to left lower extremity- He has radiating low back pain and is hyperreflexic in his left patella.  I have asked him to undergo an MRI to see if he has a disc herniation, nerve impingement, or tumor that is causing his symptoms that may benefit from surgical intervention.  His normal CBC and sed rate is reassuring that he does not have an infectious or inflammatory process.  We will add a muscle relaxer to the ibuprofen for additional symptom relief. -     MR Lumbar Spine Wo Contrast; Future -     CBC with Differential/Platelet; Future -     Sedimentation rate;  Future -     metaxalone (SKELAXIN) 400 MG tablet; Take 2 tablets (800 mg total) by mouth 3 (three) times daily.   I am having Lauris ChromanJacob Benitez start on metaxalone. I am also having him maintain his ibuprofen.  Meds ordered this encounter  Medications  . metaxalone (SKELAXIN) 400 MG tablet    Sig: Take 2 tablets (800 mg total) by mouth 3 (three) times daily.    Dispense:  180 tablet    Refill:  1     Follow-up: Return in about 2 months (around 02/03/2018).  Sanda Lingerhomas Ione [place], MD

## 2017-12-18 ENCOUNTER — Other Ambulatory Visit: Payer: Self-pay | Admitting: Internal Medicine

## 2017-12-18 ENCOUNTER — Telehealth: Payer: Self-pay | Admitting: Internal Medicine

## 2017-12-18 DIAGNOSIS — M79605 Pain in left leg: Principal | ICD-10-CM

## 2017-12-18 DIAGNOSIS — M545 Low back pain, unspecified: Secondary | ICD-10-CM

## 2017-12-18 MED ORDER — CYCLOBENZAPRINE HCL 5 MG PO TABS: 5.0000 mg | ORAL_TABLET | Freq: Three times a day (TID) | ORAL | 1 refills | Status: DC | PRN

## 2017-12-18 NOTE — Telephone Encounter (Signed)
Try flexeril 

## 2017-12-18 NOTE — Telephone Encounter (Signed)
Is there an alternative to skelaxin? Pt states that it is too expensive.

## 2017-12-18 NOTE — Telephone Encounter (Signed)
Copied from CRM [id number]. Topic: Quick Communication - See Telephone Encounter >> Dec 18, 2017 11:22 AM Jens Som A wrote: CRM for notification. See Telephone encounter for: 12/18/17.  Patient is calling regarding metaxalone (SKELAXIN) 400 MG tablet [[id number]] Patient was told that the medication is $1003.00 Is requesting a medication that is a lot more affordable.  Patient is deaf. Video Relay # [id number] Preffered Pharmacy- [id number] [place], [place] - [address] AT [facility] [address] [place] [address] Phone: [phone] Fax: [phone]

## 2017-12-19 ENCOUNTER — Ambulatory Visit
Admission: RE | Admit: 2017-12-19 | Discharge: 2017-12-19 | Disposition: A | Discharge status: Home / Self Care | Payer: Medicare Other | Source: Ambulatory Visit | Attending: Internal Medicine | Admitting: Internal Medicine

## 2017-12-19 DIAGNOSIS — M545 Low back pain, unspecified: Secondary | ICD-10-CM

## 2017-12-19 DIAGNOSIS — M79605 Pain in left leg: Principal | ICD-10-CM

## 2017-12-28 ENCOUNTER — Telehealth: Payer: Self-pay | Admitting: Internal Medicine

## 2017-12-28 NOTE — Telephone Encounter (Signed)
Copied from CRM [id number]. Topic: Quick Communication - See Telephone Encounter >> Dec 28, 2017  4:42 PM Jens SomMedley, Jennifer A wrote: CRM for notification. See Telephone encounter for: 12/28/17. Patient is calling regarding MRI  result he is deaf and has video relay Please call [phone]. Thanks!

## 2018-01-01 NOTE — Telephone Encounter (Signed)
Pt is not available at this time. Unable to leave a message. I have sent a letter to address on file for pt.

## 2018-03-14 DIAGNOSIS — J209 Acute bronchitis, unspecified: Secondary | ICD-10-CM | POA: Diagnosis not present

## 2018-03-28 ENCOUNTER — Encounter: Payer: Self-pay | Admitting: Nurse Practitioner

## 2018-03-28 ENCOUNTER — Other Ambulatory Visit (INDEPENDENT_AMBULATORY_CARE_PROVIDER_SITE_OTHER): Payer: Medicare Other

## 2018-03-28 ENCOUNTER — Ambulatory Visit (INDEPENDENT_AMBULATORY_CARE_PROVIDER_SITE_OTHER): Payer: Medicare Other | Admitting: Nurse Practitioner

## 2018-03-28 VITALS — BP 122/80 | HR 59 | Temp 98.3°F | Ht 70.0 in | Wt 216.0 lb

## 2018-03-28 DIAGNOSIS — R197 Diarrhea, unspecified: Secondary | ICD-10-CM | POA: Diagnosis not present

## 2018-03-28 LAB — COMPREHENSIVE METABOLIC PANEL
ALBUMIN: 4.2 g/dL (ref 3.5–5.2)
ALK PHOS: 60 U/L (ref 39–117)
ALT: 59 U/L — AB (ref 0–53)
AST: 29 U/L (ref 0–37)
BILIRUBIN TOTAL: 0.9 mg/dL (ref 0.2–1.2)
BUN: 18 mg/dL (ref 6–23)
CO2: 28 mEq/L (ref 19–32)
Calcium: 9.1 mg/dL (ref 8.4–10.5)
Chloride: 105 mEq/L (ref 96–112)
Creatinine, Ser: 1 mg/dL (ref 0.40–1.50)
GFR: 89.03 mL/min (ref >60.00)
Glucose, Bld: 111 mg/dL — ABNORMAL HIGH (ref 70–99)
Potassium: 3.6 mEq/L (ref 3.5–5.1)
SODIUM: 140 meq/L (ref 135–145)
TOTAL PROTEIN: 7.7 g/dL (ref 6.0–8.3)

## 2018-03-28 LAB — CBC
HCT: 47 % (ref 39.0–52.0)
HEMOGLOBIN: 16.2 g/dL (ref 13.0–17.0)
MCHC: 34.5 g/dL (ref 30.0–36.0)
MCV: 86.8 fl (ref 78.0–100.0)
Platelets: 252 10*3/uL (ref 150.0–400.0)
RBC: 5.42 Mil/uL (ref 4.22–5.81)
RDW: 13.2 % (ref 11.5–15.5)
WBC: 8 10*3/uL (ref 4.0–10.5)

## 2018-03-28 MED ORDER — LOPERAMIDE HCL 2 MG PO TABS: 2.0000 mg | ORAL_TABLET | Freq: Four times a day (QID) | ORAL | 0 refills | Status: DC | PRN

## 2018-03-28 NOTE — Progress Notes (Signed)
Derrick Benitez is a 37 y.o. male with the following history as recorded in EpicCare:  Patient Active Problem List   Diagnosis Date Noted  . Low back pain radiating to left lower extremity 11/02/2017  . Hepatitis B antibody positive 06/13/2017  . Hyperlipidemia LDL goal <160 06/12/2017  . Redundant foreskin 05/22/2017    Current Outpatient Medications  Medication Sig Dispense Refill  . cyclobenzaprine (FLEXERIL) 5 MG tablet Take 1 tablet (5 mg total) by mouth 3 (three) times daily as needed for muscle spasms. 90 tablet 1  . ibuprofen (ADVIL,MOTRIN) 600 MG tablet Take 1 tablet (600 mg total) by mouth every 8 (eight) hours as needed. 180 tablet 0  . loperamide (IMODIUM A-D) 2 MG tablet Take 1 tablet (2 mg total) by mouth 4 (four) times daily as needed for diarrhea or loose stools. 30 tablet 0   No current facility-administered medications for this visit.     Allergies: Patient has no known allergies.  History reviewed. No pertinent past medical history.  History reviewed. No pertinent surgical history.  History reviewed. No pertinent family history.  Social History   Tobacco Use  . Smoking status: Never Smoker  . Smokeless tobacco: Never Used  Substance Use Topics  . Alcohol use: Yes    Alcohol/week: 3.0 standard drinks    Types: 3 Cans of beer per week    Frequency: Never     Subjective:  Derrick Benitez is here today requesting evaluation of acute complaint of diarrhea, he is accompanied by sign language interpreter today. He tells me hes had diarrhea, ongoing daily for about 2.5 weeks now, describes as soft stools about 5-6 times per day, occurring after oral intake, with every meal or snack he eats. He denies fevers, chills, weakness, dizziness, nausea, vomiting, abdominal pain, urinary frequency, rectal bleeding He did try one dose of an OTC anti-diarrheal which did not help Denies recent travel, abx, new foods   ROS- See HPI  Objective:  Vitals:   03/28/18 1414  BP: 122/80   Pulse: (!) 59  Temp: 98.3 F (36.8 C)  TempSrc: Oral  SpO2: 97%  Weight: 216 lb (98 kg)  Height: 5\' 10"  (1.778 m)    General: Well developed, well nourished, in no acute distress  Skin : Warm and dry.  Head: Normocephalic and atraumatic  Eyes: Sclera and conjunctiva clear; pupils round and reactive to light; extraocular movements intact  Oropharynx: Pink, supple. No suspicious lesions  Neck: Supple Lungs: Respirations unlabored; clear to auscultation bilaterally  CVS exam: normal rate and regular rhythm, S1 and S2 normal.  Abdomen: Soft; mild generalized tenderness without rigidity or guarding; nondistended; normoactive bowel sounds; no masses or hepatosplenomegaly  Extremities: No edema, cyanosis, clubbing  Vessels: Symmetric bilaterally  Neurologic: Alert and oriented; speech intact; face symmetrical; moves all extremities well; CNII-XII intact without focal deficit  Psychiatric: Normal mood and affect.  Assessment:  1. Diarrhea, unspecified type     Plan:   Start imodium- rx sent, dosing and side effects discussed Home management, diarrhea diet, red flags and return precautions including when to seek immediate care discussed and printed on AVS F/U with further recommendations pending lab results   No follow-ups on file.  Orders Placed This Encounter  Procedures  . CBC    Standing Status:   Future    Number of Occurrences:   1    Standing Expiration Date:   03/29/2019  . Comprehensive metabolic panel    Standing Status:   Future  Number of Occurrences:   1    Standing Expiration Date:   03/29/2019  . Gastrointestinal Pathogen Panel PCR    Standing Status:   Future    Standing Expiration Date:   03/29/2019    Requested Prescriptions   Signed Prescriptions Disp Refills  . loperamide (IMODIUM A-D) 2 MG tablet 30 tablet 0    Sig: Take 1 tablet (2 mg total) by mouth 4 (four) times daily as needed for diarrhea or loose stools.

## 2018-03-28 NOTE — Patient Instructions (Addendum)
Start imodium 4 mg, followed by 2 mg after each loose stool (maximum: 16 mg/day).  Head downstairs for labs today   Bland Diet A bland diet consists of foods that are often soft and do not have a lot of fat, fiber, or extra seasonings. Foods without fat, fiber, or seasoning are easier for the body to digest. They are also less likely to irritate your mouth, throat, stomach, and other parts of your digestive system. A bland diet is sometimes called a BRAT diet. What is my plan? Your health care provider or food and nutrition specialist (dietitian) may recommend specific changes to your diet to prevent symptoms or to treat your symptoms. These changes may include:  Eating small meals often.  Cooking food until it is soft enough to chew easily.  Chewing your food well.  Drinking fluids slowly.  Not eating foods that are very spicy, sour, or fatty.  Not eating citrus fruits, such as oranges and grapefruit. What do I need to know about this diet?  Eat a variety of foods from the bland diet food list.  Do not follow a bland diet longer than needed.  Ask your health care provider whether you should take vitamins or supplements. What foods can I eat? Grains  Hot cereals, such as cream of wheat. Rice. Bread, crackers, or tortillas made from refined white flour. Vegetables Canned or cooked vegetables. Mashed or boiled potatoes. Fruits  Bananas. Applesauce. Other types of cooked or canned fruit with the skin and seeds removed, such as canned peaches or pears. Meats and other proteins  Scrambled eggs. Creamy peanut butter or other nut butters. Lean, well-cooked meats, such as chicken or fish. Tofu. Soups or broths. Dairy Low-fat dairy products, such as milk, cottage cheese, or yogurt. Beverages  Water. Herbal tea. Apple juice. Fats and oils Mild salad dressings. Canola or olive oil. Sweets and desserts Pudding. Custard. Fruit gelatin. Ice cream. The items listed above may not be  a complete list of recommended foods and beverages. Contact a dietitian for more options. What foods are not recommended? Grains Whole grain breads and cereals. Vegetables Raw vegetables. Fruits Raw fruits, especially citrus, berries, or dried fruits. Dairy Whole fat dairy foods. Beverages Caffeinated drinks. Alcohol. Seasonings and condiments Strongly flavored seasonings or condiments. Hot sauce. Salsa. Other foods Spicy foods. Fried foods. Sour foods, such as pickled or fermented foods. Foods with high sugar content. Foods high in fiber. The items listed above may not be a complete list of foods and beverages to avoid. Contact a dietitian for more information. Summary  A bland diet consists of foods that are often soft and do not have a lot of fat, fiber, or extra seasonings.  Foods without fat, fiber, or seasoning are easier for the body to digest.  Check with your health care provider to see how long you should follow this diet plan. It is not meant to be followed for long periods. This information is not intended to replace advice given to you by your health care provider. Make sure you discuss any questions you have with your health care provider. Document Released: 07/20/2015 Document Revised: 04/26/2017 Document Reviewed: 04/26/2017 Elsevier Interactive Patient Education  2019 [employer]

## 2018-03-30 LAB — GASTROINTESTINAL PATHOGEN PANEL PCR
C. difficile Tox A/B, PCR: UNDETERMINED — AB
CAMPYLOBACTER, PCR: UNDETERMINED — AB
Cryptosporidium, PCR: UNDETERMINED — AB
E coli (ETEC) LT/ST PCR: UNDETERMINED — AB
E coli (STEC) stx1/stx2, PCR: UNDETERMINED — AB
E coli 0157, PCR: UNDETERMINED — AB
Giardia lamblia, PCR: UNDETERMINED — AB
NOROVIRUS, PCR: UNDETERMINED — AB
Rotavirus A, PCR: UNDETERMINED — AB
SALMONELLA, PCR: UNDETERMINED — AB
SHIGELLA, PCR: UNDETERMINED — AB

## 2018-04-02 ENCOUNTER — Other Ambulatory Visit: Payer: Self-pay | Admitting: Nurse Practitioner

## 2018-04-02 DIAGNOSIS — R197 Diarrhea, unspecified: Secondary | ICD-10-CM

## 2018-04-02 MED ORDER — CIPROFLOXACIN HCL 500 MG PO TABS: 500.0000 mg | ORAL_TABLET | Freq: Two times a day (BID) | ORAL | 0 refills | Status: DC

## 2018-04-02 MED ORDER — METRONIDAZOLE 500 MG PO TABS: 500.0000 mg | ORAL_TABLET | Freq: Three times a day (TID) | ORAL | 0 refills | Status: DC

## 2018-05-10 ENCOUNTER — Encounter: Payer: Self-pay | Admitting: Gastroenterology

## 2018-05-10 ENCOUNTER — Ambulatory Visit (INDEPENDENT_AMBULATORY_CARE_PROVIDER_SITE_OTHER): Payer: Medicare Other | Admitting: Gastroenterology

## 2018-05-10 VITALS — BP 114/84 | HR 66 | Ht 70.0 in | Wt 217.0 lb

## 2018-05-10 DIAGNOSIS — K625 Hemorrhage of anus and rectum: Secondary | ICD-10-CM | POA: Diagnosis not present

## 2018-05-10 DIAGNOSIS — R1032 Left lower quadrant pain: Secondary | ICD-10-CM | POA: Diagnosis not present

## 2018-05-10 DIAGNOSIS — R197 Diarrhea, unspecified: Secondary | ICD-10-CM | POA: Insufficient documentation

## 2018-05-10 MED ORDER — NA SULFATE-K SULFATE-MG SULF 17.5-3.13-1.6 GM/177ML PO SOLN: ORAL | 0 refills | Status: DC

## 2018-05-10 NOTE — Patient Instructions (Signed)
If you are age 38 or older, your body mass index should be between 23-30. Your Body mass index is 31.14 kg/m. If this is out of the aforementioned range listed, please consider follow up with your Primary Care Provider.  If you are age 16 or younger, your body mass index should be between 19-25. Your Body mass index is 31.14 kg/m. If this is out of the aformentioned range listed, please consider follow up with your Primary Care Provider.   You have been scheduled for a colonoscopy. Please follow written instructions given to you at your visit today.  Please pick up your prep supplies at the pharmacy within the next 1-3 days. If you use inhalers (even only as needed), please bring them with you on the day of your procedure. Your physician has requested that you go to www.startemmi.com and enter the access code given to you at your visit today. This web site gives a general overview about your procedure. However, you should still follow specific instructions given to you by our office regarding your preparation for the procedure.  We have sent the following medications to your pharmacy for you to pick up at your convenience: Suprep  Thank you for choosing me and [facility].   Doug Sou, PA-C

## 2018-05-10 NOTE — Progress Notes (Signed)
05/10/2018 Derrick Benitez [id number] 1981-01-05   HISTORY OF PRESENT ILLNESS: This is a pleasant 38 year old male who is new to our practice.  He was referred here by Catarina Hartshorn, NP, for evaluation regarding diarrhea.  The patient is deaf and so the visit was performed via sign language interpreter.  In regards to the diarrhea it seems that issue has resolved.  He tells me his diarrhea came on suddenly the second or third week in November.  He then was treated with a course of Cipro and Flagyl at some point and says that he has not had any diarrhea over the last couple of weeks since taking that medication.  He says that when he was having the diarrhea he described about 6 or 7 stools per day.  He does tell me that he had seen some blood in the stools with a few of the episodes of diarrhea and describes it as a moderate amount in volume.  He says that he has seen blood in his stools on and off in the past as well.  After speaking with him further he also admits to left-sided abdominal pain that comes frequently and feels like a significant cramping sensation.  CBC normal in 03/2018.   History reviewed. No pertinent past medical history. History reviewed. No pertinent surgical history.  reports that he has never smoked. He has never used smokeless tobacco. He reports current alcohol use of about 3.0 standard drinks of alcohol per week. He reports current drug use. Drug: Marijuana. family history is not on file. No Known Allergies    Outpatient Encounter Medications as of 05/10/2018  Medication Sig  . cyclobenzaprine (FLEXERIL) 5 MG tablet Take 1 tablet (5 mg total) by mouth 3 (three) times daily as needed for muscle spasms.  Marland Kitchen loperamide (IMODIUM A-D) 2 MG tablet Take 1 tablet (2 mg total) by mouth 4 (four) times daily as needed for diarrhea or loose stools.  . [DISCONTINUED] ibuprofen (ADVIL,MOTRIN) 600 MG tablet Take 1 tablet (600 mg total) by mouth every 8 (eight) hours as needed.  .  ciprofloxacin (CIPRO) 500 MG tablet Take 1 tablet (500 mg total) by mouth 2 (two) times daily. (Patient not taking: Reported on 05/10/2018)  . metroNIDAZOLE (FLAGYL) 500 MG tablet Take 1 tablet (500 mg total) by mouth 3 (three) times daily. (Patient not taking: Reported on 05/10/2018)  . Na Sulfate-K Sulfate-Mg Sulf 17.5-3.13-1.6 GM/177ML SOLN Suprep-Use as directed   No facility-administered encounter medications on file as of 05/10/2018.      REVIEW OF SYSTEMS  : All other systems reviewed and negative except where noted in the History of Present Illness.   PHYSICAL EXAM: BP 114/84   Pulse 66   Ht 5\' 10"  (1.778 m)   Wt 217 lb (98.4 kg)   BMI 31.14 kg/m  General: Well developed white male in no acute distress Head: Normocephalic and atraumatic Eyes:  Sclerae anicteric, conjunctiva pink. Ears: Normal auditory acuity Lungs: Clear throughout to auscultation; no increased WOB. Heart: Regular rate and rhythm; no M/R/G. Abdomen: Soft, non-distended.  BS present.  Left sided TTP. Rectal:  Will be done at the time of colonoscopy. Musculoskeletal: Symmetrical with no gross deformities  Skin: No lesions on visible extremities Extremities: No edema  Neurological: Alert oriented x 4, grossly non-focal Psychological:  Alert and cooperative. Normal mood and affect  ASSESSMENT AND PLAN: *38 year old male with diarrheal illness that seems to have responded to a course of antibiotics of Cipro and Flagyl, but  he does describe intermittent episodes of rectal bleeding, sometimes moderate in amounts and also intermittent left-sided abdominal pain/cramping.  At this point the diarrhea has resolved and does not seem to be a chronic issue, but with the bleeding and the pain, question if he could have some left-sided IBD, etc.  We will schedule for colonoscopy with Dr. Marina GoodellPerry for evaluation.  **The risks, benefits, and alternatives to colonoscopy were discussed with the patient and he consents to proceed.    CC:  Evaristo BuryShambley, Ashleigh N, NP

## 2018-05-10 NOTE — Progress Notes (Signed)
Assessment and plan reviewed 

## 2018-05-16 ENCOUNTER — Ambulatory Visit (AMBULATORY_SURGERY_CENTER): Payer: Medicare Other | Admitting: Internal Medicine

## 2018-05-16 ENCOUNTER — Encounter: Payer: Self-pay | Admitting: Internal Medicine

## 2018-05-16 VITALS — BP 137/71 | HR 71 | Temp 98.9°F | Resp 15 | Ht 70.0 in | Wt 210.0 lb

## 2018-05-16 DIAGNOSIS — R197 Diarrhea, unspecified: Secondary | ICD-10-CM

## 2018-05-16 DIAGNOSIS — R1032 Left lower quadrant pain: Secondary | ICD-10-CM

## 2018-05-16 DIAGNOSIS — K625 Hemorrhage of anus and rectum: Secondary | ICD-10-CM | POA: Diagnosis not present

## 2018-05-16 MED ORDER — SODIUM CHLORIDE 0.9 % IV SOLN: 500.0000 mL | Freq: Once | INTRAVENOUS | Status: DC

## 2018-05-16 NOTE — Progress Notes (Signed)
A/ox3, pleased with MAC, report to RN 

## 2018-05-16 NOTE — Op Note (Signed)
[facility] Patient Name: Derrick Benitez Procedure Date: 05/16/2018 1:18 PM MRN: [id number] Endoscopist: Wilhemina Bonito. Marina Goodell , MD Age: 38 Referring MD:  Date of Birth: 1981-04-05 Gender: Male Account #: [id number] Procedure:                Colonoscopy Indications:              Abdominal pain in the left lower quadrant,                            Diarrhea, Rectal bleeding Medicines:                Monitored Anesthesia Care Procedure:                Pre-Anesthesia Assessment:                           - Prior to the procedure, a History and Physical                            was performed, and patient medications and                            allergies were reviewed. The patient's tolerance of                            previous anesthesia was also reviewed. The risks                            and benefits of the procedure and the sedation                            options and risks were discussed with the patient.                            All questions were answered, and informed consent                            was obtained. Prior Anticoagulants: The patient has                            taken no previous anticoagulant or antiplatelet                            agents. ASA Grade Assessment: I - A normal, healthy                            patient. After reviewing the risks and benefits,                            the patient was deemed in satisfactory condition to                            undergo the procedure.  After obtaining informed consent, the colonoscope                            was passed under direct vision. Throughout the                            procedure, the patient's blood pressure, pulse, and                            oxygen saturations were monitored continuously. The                            Colonoscope was introduced through the anus and                            advanced to the the cecum, identified by         appendiceal orifice and ileocecal valve. The                            terminal ileum, ileocecal valve, appendiceal                            orifice, and rectum were photographed. The quality                            of the bowel preparation was excellent. The                            colonoscopy was performed without difficulty. The                            patient tolerated the procedure well. The bowel                            preparation used was SUPREP. Scope In: 1:23:34 PM Scope Out: 1:33:17 PM Scope Withdrawal Time: 0 hours 7 minutes 54 seconds  Total Procedure Duration: 0 hours 9 minutes 43 seconds  Findings:                 The terminal ileum appeared normal.                           Internal hemorrhoids were found during                            retroflexion. The hemorrhoids were moderate.                           The exam was otherwise without abnormality on                            direct and retroflexion views. Complications:            No immediate complications. Estimated blood loss:  None. Estimated Blood Loss:     Estimated blood loss: none. Impression:               - The examined portion of the ileum was normal.                           - Internal hemorrhoids.                           - The examination was otherwise normal on direct                            and retroflexion views.                           - No specimens collected. Recommendation:           - Repeat colonoscopy at age 55 for screening              79              purposes.                           - Patient has a contact number available for                            emergencies. The signs and symptoms of potential                            delayed complications were discussed with the                            patient. Return to normal activities tomorrow.                            Written discharge instructions were provided to the                             patient.                           - Resume previous diet.                           - Continue present medications. Wilhemina BonitoJohn N. Marina GoodellPerry, MD 05/16/2018 1:40:08 PM This report has been signed electronically.

## 2018-05-16 NOTE — Patient Instructions (Signed)
Handout given on hemorrhoids.  Good 1st meal: EGGS GRITS TOAST PANCAKES AND WAFFLES LEAN MEAT  YOU HAD AN ENDOSCOPIC PROCEDURE TODAY AT THE [facility]:   Refer to the procedure report that was given to you for any specific questions about what was found during the examination.  If the procedure report does not answer your questions, please call your gastroenterologist to clarify.  If you requested that your care partner not be given the details of your procedure findings, then the procedure report has been included in a sealed envelope for you to review at your convenience later.  YOU SHOULD EXPECT: Some feelings of bloating in the abdomen. Passage of more gas than usual.  Walking can help get rid of the air that was put into your GI tract during the procedure and reduce the bloating. If you had a lower endoscopy (such as a colonoscopy or flexible sigmoidoscopy) you may notice spotting of blood in your stool or on the toilet paper. If you underwent a bowel prep for your procedure, you may not have a normal bowel movement for a few days.  Please Note:  You might notice some irritation and congestion in your nose or some drainage.  This is from the oxygen used during your procedure.  There is no need for concern and it should clear up in a day or so.  SYMPTOMS TO REPORT IMMEDIATELY:   Following lower endoscopy (colonoscopy or flexible sigmoidoscopy):  Excessive amounts of blood in the stool  Significant tenderness or worsening of abdominal pains  Swelling of the abdomen that is new, acute  Fever of 100F or higher   For urgent or emergent issues, a gastroenterologist can be reached at any hour by calling [phone].   DIET:  We do recommend a small meal at first, but then you may proceed to your regular diet.  Drink plenty of fluids but you should avoid alcoholic beverages for 24 hours.  ACTIVITY:  You should plan to take it easy for the rest of today and you should NOT  DRIVE or use heavy machinery until tomorrow (because of the sedation medicines used during the test).    FOLLOW UP: Our staff will call the number listed on your records the next business day following your procedure to check on you and address any questions or concerns that you may have regarding the information given to you following your procedure. If we do not reach you, we will leave a message.  However, if you are feeling well and you are not experiencing any problems, there is no need to return our call.  We will assume that you have returned to your regular daily activities without incident.  If any biopsies were taken you will be contacted by phone or by letter within the next 1-3 weeks.  Please call us at [phone] if you have not heard about the biopsies in 3 weeks.    SIGNATURES/CONFIDENTIALITY: You and/or your care partner have signed paperwork which will be entered into your electronic medical record.  These signatures attest to the fact that that the information above on your After Visit Summary has been reviewed and is understood.  Full responsibility of the confidentiality of this discharge information lies with you and/or your care-partner.

## 2018-05-17 ENCOUNTER — Telehealth: Payer: Self-pay

## 2018-05-17 NOTE — Telephone Encounter (Signed)
Patient called back states he is doing just fine after procedure states has been getting plenty of sleep.

## 2018-05-17 NOTE — Telephone Encounter (Signed)
Called [phone] and left a messaged we tried to reach pt for a follow up call.  Pt is deaf and he has a Development worker, community that sent a Chief Executive Officer to pt [phone].maw

## 2018-05-17 NOTE — Telephone Encounter (Signed)
Attempted to call patient for post-procedure f/u call using the hearing impaired interpreter service # on his chart. Interpreter states there is no answer. Left message that we are calling to check to make sure he is doing well after his procedure yesterday and for him to please not hesitate to call us if he has any questions/concerns regarding his care. Interpreter # is [phone].

## 2018-10-25 ENCOUNTER — Ambulatory Visit: Payer: Self-pay

## 2018-10-25 NOTE — Telephone Encounter (Signed)
Can we get him in for an appointment please?

## 2018-10-25 NOTE — Telephone Encounter (Signed)
Pt is scheduled with burns on 7/17

## 2018-10-25 NOTE — Telephone Encounter (Signed)
Patient called in using a sign language interpreter and says that he's been passing blood in his stool. He says this started a week ago and it happens everytime he has a bowel movement. He says at first the water turned red and red in the stool, but now it's light colored today when he wiped. He says yesterday he was constipated and it was a lot of blood that passed with the stool, but today light. He says he feels like there is pressure to the anal area and it burns, so he's been using a hemorrhoid cream. He says his bowel movements have been soft for the most part, no diarrhea. He says he was cramping the first day this started, but now it's just a pressure to his lower abdomen. He denies fever, no other symptoms. I called the office and spoke to Sam, [facility] who asks to speak to the patient, the call was connected successfully.  Answer Assessment - Initial Assessment Questions 1. APPEARANCE of BLOOD: "What color is it?" "Is it passed separately, on the surface of the stool, or mixed in with the stool?"      Bright red, dark red on tissue 2. AMOUNT: "How much blood was passed?"      First time the water in the toilet was red, after that it was in the stool 3. FREQUENCY: "How many times has blood been passed with the stools?"      Everytime that I have a BM, there is blood 4. ONSET: "When was the blood first seen in the stools?" (Days or weeks)      About a week, has gotten lighter, then yesterday bad again 5. DIARRHEA: "Is there also some diarrhea?" If so, ask: "How many diarrhea stools were passed in past 24 hours?"      No 6. CONSTIPATION: "Do you have constipation?" If so, "How bad is it?"     Yes, yesterday 7. RECURRENT SYMPTOMS: "Have you had blood in your stools before?" If so, ask: "When was the last time?" and "What happened that time?"      No 8. BLOOD THINNERS: "Do you take any blood thinners?" (e.g., Coumadin/warfarin, Pradaxa/dabigatran, aspirin)     No 9. OTHER SYMPTOMS: "Do you have any  other symptoms?"  (e.g., abdominal pain, vomiting, dizziness, fever)     Pressure to the abdomen and rectum, burning to the rectum 10. PREGNANCY: "Is there any chance you are pregnant?" "When was your last menstrual period?"      N/A  Protocols used: RECTAL BLEEDING-A-AH

## 2018-10-25 NOTE — Progress Notes (Signed)
Subjective:    Patient ID: Derrick Benitez, male    DOB: 02/08/1981, 38 y.o.   MRN: [id number]  HPI The patient is here for an acute visit.  He is deaf and sign language interpreter was present.   Rectal bleeding: He has a history of rectal bleeding secondary to internal hemorrhoids.  He had bleeding that started last week and had a few episodes of bright red blood per rectum.  The first time it occurred he had lower abdominal cramping and felt like he had to have a bowel movement, but when he went to go to the bathroom he only had blood come out that was in the toilet water.  He later had a bowel movement.  He has had a couple of episodes of bleeding, but has had bowel movements without blood.  He does have intermittent constipation.  He did use the suppository he was given by GI and that does help.  He does note some pressure or burning sensation internally.   He had rectal bleeding after diarrhea in January 2020 and ended up seeing GI.  He was found to have internal hemorrhoids, but the colonoscopy was otherwise normal.      Medications and allergies reviewed with patient and updated if appropriate.  Patient Active Problem List   Diagnosis Date Noted  . Rectal bleeding 05/10/2018  . Diarrhea 05/10/2018  . LLQ abdominal pain 05/10/2018  . Low back pain radiating to left lower extremity 11/02/2017  . Hepatitis B antibody positive 06/13/2017  . Hyperlipidemia LDL goal <160 06/12/2017  . Redundant foreskin 05/22/2017    No current outpatient medications on file prior to visit.   No current facility-administered medications on file prior to visit.     Past Medical History:  Diagnosis Date  . Deaf   . Irritable bowel syndrome (IBS)     History reviewed. No pertinent surgical history.  Social History   Socioeconomic History  . Marital status: Single    Spouse name: Not on file  . Number of children: Not on file  . Years of education: Not on file  . Highest education  level: Not on file  Occupational History  . Not on file  Social Needs  . Financial resource strain: Not on file  . Food insecurity    Worry: Not on file    Inability: Not on file  . Transportation needs    Medical: Not on file    Non-medical: Not on file  Tobacco Use  . Smoking status: Never Smoker  . Smokeless tobacco: Never Used  Substance and Sexual Activity  . Alcohol use: Yes    Alcohol/week: 3.0 standard drinks    Types: 3 Cans of beer per week    Frequency: Never    Comment: occ   . Drug use: Yes    Types: Marijuana    Comment: Occ use  . Sexual activity: Yes  Lifestyle  . Physical activity    Days per week: Not on file    Minutes per session: Not on file  . Stress: Not on file  Relationships  . Social Musicianconnections    Talks on phone: Not on file    Gets together: Not on file    Attends religious service: Not on file    Active member of club or organization: Not on file    Attends meetings of clubs or organizations: Not on file    Relationship status: Not on file  Other Topics Concern  .  Not on file  Social History Narrative  . Not on file    Family History  Problem Relation Age of Onset  . Stomach cancer Neg Hx   . Colon cancer Neg Hx     Review of Systems  Constitutional: Negative for fever.  Gastrointestinal: Positive for abdominal pain (cramping prior to BM), anal bleeding and constipation. Negative for blood in stool.  Neurological: Positive for headaches.       Objective:   Vitals:   10/26/18 1052  BP: 128/90  Pulse: 75  Temp: 98.1 F (36.7 C)  SpO2: 96%   BP Readings from Last 3 Encounters:  10/26/18 128/90  05/16/18 137/71  05/10/18 114/84   Wt Readings from Last 3 Encounters:  10/26/18 227 lb (103 kg)  05/16/18 210 lb (95.3 kg)  05/10/18 217 lb (98.4 kg)   Body mass index is 32.57 kg/m.   Physical Exam Constitutional:      General: He is not in acute distress.    Appearance: Normal appearance. He is not ill-appearing.   HENT:     Head: Normocephalic and atraumatic.  Abdominal:     General: There is no distension.     Palpations: Abdomen is soft.     Tenderness: There is abdominal tenderness (Minimal tenderness in suprapubic region). There is no guarding or rebound.  Skin:    General: Skin is warm and dry.  Neurological:     Mental Status: He is alert.            Assessment & Plan:    See Problem List for Assessment and Plan of chronic medical problems.

## 2018-10-26 ENCOUNTER — Other Ambulatory Visit: Payer: Self-pay

## 2018-10-26 ENCOUNTER — Ambulatory Visit: Payer: Medicare Other | Admitting: Internal Medicine

## 2018-10-26 ENCOUNTER — Ambulatory Visit (INDEPENDENT_AMBULATORY_CARE_PROVIDER_SITE_OTHER): Payer: Medicare Other | Admitting: Internal Medicine

## 2018-10-26 ENCOUNTER — Encounter: Payer: Self-pay | Admitting: Internal Medicine

## 2018-10-26 ENCOUNTER — Telehealth: Payer: Self-pay

## 2018-10-26 VITALS — BP 128/90 | HR 75 | Temp 98.1°F | Ht 70.0 in | Wt 227.0 lb

## 2018-10-26 DIAGNOSIS — K625 Hemorrhage of anus and rectum: Secondary | ICD-10-CM | POA: Diagnosis not present

## 2018-10-26 MED ORDER — HYDROCORTISONE ACETATE 25 MG RE SUPP: 25.0000 mg | Freq: Two times a day (BID) | RECTAL | 5 refills | Status: DC | PRN

## 2018-10-26 MED ORDER — HYDROCORTISONE (PERIANAL) 2.5 % EX CREA: 1.0000 "application " | TOPICAL_CREAM | Freq: Two times a day (BID) | CUTANEOUS | 0 refills | Status: DC

## 2018-10-26 NOTE — Patient Instructions (Addendum)
Start taking metamucil daily.  This is daily fiber.   Drink plenty of water daily.    Use the suppositories as needed for the hemorrhoids.      If you continue to have intermittent bleeding let Dr Ronnald Ramp know.      Hemorrhoids Hemorrhoids are swollen veins in and around the rectum or anus. There are two types of hemorrhoids:  Internal hemorrhoids. These occur in the veins that are just inside the rectum. They may poke through to the outside and become irritated and painful.  External hemorrhoids. These occur in the veins that are outside the anus and can be felt as a painful swelling or hard lump near the anus. Most hemorrhoids do not cause serious problems, and they can be managed with home treatments such as diet and lifestyle changes. If home treatments do not help the symptoms, procedures can be done to shrink or remove the hemorrhoids. What are the causes? This condition is caused by increased pressure in the anal area. This pressure may result from various things, including:  Constipation.  Straining to have a bowel movement.  Diarrhea.  Pregnancy.  Obesity.  Sitting for long periods of time.  Heavy lifting or other activity that causes you to strain.  Anal sex.  Riding a bike for a long period of time. What are the signs or symptoms? Symptoms of this condition include:  Pain.  Anal itching or irritation.  Rectal bleeding.  Leakage of stool (feces).  Anal swelling.  One or more lumps around the anus. How is this diagnosed? This condition can often be diagnosed through a visual exam. Other exams or tests may also be done, such as:  An exam that involves feeling the rectal area with a gloved hand (digital rectal exam).  An exam of the anal canal that is done using a small tube (anoscope).  A blood test, if you have lost a significant amount of blood.  A test to look inside the colon using a flexible tube with a camera on the end (sigmoidoscopy or  colonoscopy). How is this treated? This condition can usually be treated at home. However, various procedures may be done if dietary changes, lifestyle changes, and other home treatments do not help your symptoms. These procedures can help make the hemorrhoids smaller or remove them completely. Some of these procedures involve surgery, and others do not. Common procedures include:  Rubber band ligation. Rubber bands are placed at the base of the hemorrhoids to cut off their blood supply.  Sclerotherapy. Medicine is injected into the hemorrhoids to shrink them.  Infrared coagulation. A type of light energy is used to get rid of the hemorrhoids.  Hemorrhoidectomy surgery. The hemorrhoids are surgically removed, and the veins that supply them are tied off.  Stapled hemorrhoidopexy surgery. The surgeon staples the base of the hemorrhoid to the rectal wall. Follow these instructions at home: Eating and drinking   Eat foods that have a lot of fiber in them, such as whole grains, beans, nuts, fruits, and vegetables.  Ask your health care provider about taking products that have added fiber (fiber supplements).  Reduce the amount of fat in your diet. You can do this by eating low-fat dairy products, eating less red meat, and avoiding processed foods.  Drink enough fluid to keep your urine pale yellow. Managing pain and swelling   Take warm sitz baths for 20 minutes, 3-4 times a day to ease pain and discomfort. You may do this in a bathtub  or using a portable sitz bath that fits over the toilet.  If directed, apply ice to the affected area. Using ice packs between sitz baths may be helpful. ? Put ice in a plastic bag. ? Place a towel between your skin and the bag. ? Leave the ice on for 20 minutes, 2-3 times a day. General instructions  Take over-the-counter and prescription medicines only as told by your health care provider.  Use medicated creams or suppositories as told.  Get regular  exercise. Ask your health care provider how much and what kind of exercise is best for you. In general, you should do moderate exercise for at least 30 minutes on most days of the week (150 minutes each week). This can include activities such as walking, biking, or yoga.  Go to the bathroom when you have the urge to have a bowel movement. Do not wait.  Avoid straining to have bowel movements.  Keep the anal area dry and clean. Use wet toilet paper or moist towelettes after a bowel movement.  Do not sit on the toilet for long periods of time. This increases blood pooling and pain.  Keep all follow-up visits as told by your health care provider. This is important. Contact a health care provider if you have:  Increasing pain and swelling that are not controlled by treatment or medicine.  Difficulty having a bowel movement, or you are unable to have a bowel movement.  Pain or inflammation outside the area of the hemorrhoids. Get help right away if you have:  Uncontrolled bleeding from your rectum. Summary  Hemorrhoids are swollen veins in and around the rectum or anus.  Most hemorrhoids can be managed with home treatments such as diet and lifestyle changes.  Taking warm sitz baths can help ease pain and discomfort.  In severe cases, procedures or surgery can be done to shrink or remove the hemorrhoids. This information is not intended to replace advice given to you by your health care provider. Make sure you discuss any questions you have with your health care provider. Document Released: 03/25/2000 Document Revised: 04/05/2018 Document Reviewed: 08/17/2017 Elsevier Patient Education  2020 [employer]

## 2018-10-26 NOTE — Telephone Encounter (Signed)
Copied from [place]. Topic: General - Inquiry >> Oct 26, 2018  3:15 PM Richardo Priest, NT wrote: Reason for CRM: Preya called in stating patient's insurance does not cover the prescription that was given today, however they do cover the rectal cream and is wondering if it can be switched to that. Please advise and call back is [phone].

## 2018-10-26 NOTE — Assessment & Plan Note (Signed)
Secondary to internal hemorrhoids Has been experiencing intermittent bleeding for approximately 5 years Colonoscopy 05/2018 showed internal hemorrhoids, but otherwise normal No need for further evaluation Discussed that if bleeding continues can consider banding of hemorrhoids Advised taking Metamucil or supplemental fiber daily to prevent constipation Suppositories as needed

## 2018-10-26 NOTE — Telephone Encounter (Signed)
Okay to change to the anusol cream?

## 2018-10-26 NOTE — Telephone Encounter (Signed)
Yes send prescription for cream to the pharmacy-he just needs to place this inside because he is internal hemorrhoids

## 2019-01-07 ENCOUNTER — Ambulatory Visit (INDEPENDENT_AMBULATORY_CARE_PROVIDER_SITE_OTHER): Payer: Medicare Other

## 2019-01-07 ENCOUNTER — Other Ambulatory Visit: Payer: Self-pay

## 2019-01-07 DIAGNOSIS — Z23 Encounter for immunization: Secondary | ICD-10-CM

## 2019-02-03 DIAGNOSIS — M47817 Spondylosis without myelopathy or radiculopathy, lumbosacral region: Secondary | ICD-10-CM | POA: Diagnosis not present

## 2019-02-03 DIAGNOSIS — M545 Low back pain: Secondary | ICD-10-CM | POA: Diagnosis not present

## 2019-02-18 DIAGNOSIS — Z20828 Contact with and (suspected) exposure to other viral communicable diseases: Secondary | ICD-10-CM | POA: Diagnosis not present

## 2019-02-18 DIAGNOSIS — Z03818 Encounter for observation for suspected exposure to other biological agents ruled out: Secondary | ICD-10-CM | POA: Diagnosis not present

## 2019-02-19 NOTE — Progress Notes (Signed)
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   I discussed the limitations of evaluation and management by telemedicine and the availability of in person appointments. The patient expressed understanding and agreed to proceed.  Present for the visit:  Myself, Dr Billey Gosling, Gerline Legacy and the interpreter who I talked on the phone with and she communicated to [place] with sign language  The patient is currently at home and I am in the office.    No referring provider.    History of Present Illness: He is here for an acute visit for cold symptoms.  His symptoms started last Tuesday.  His symptoms are a little better, but he is still having them.  He had a COVID test on Monday, two days ago, and is awaiting the result.  He has felt feverish, had chills, runny nose, nasal congestion, cough, mild tightness in chest, nausea, diarrhea, myaglias and headaches.  His appetite is decreased.  His max temp was 99.  He denies SOB.   He has tried taking tylenol.  He is drinking fluids.      He has been working.  He wears a mask.  No known COVID exposure.   Review of Systems  Constitutional: Positive for chills and fever (99).       Decreased appetite  HENT: Positive for congestion.        Runny nose.  Slightly dec taste  Respiratory: Positive for cough. Negative for shortness of breath.        Mild tightness in chest  Gastrointestinal: Positive for diarrhea and nausea.  Musculoskeletal: Positive for myalgias.  Neurological: Positive for headaches.      Social History   Socioeconomic History  . Marital status: Single    Spouse name: Not on file  . Number of children: Not on file  . Years of education: Not on file  . Highest education level: Not on file  Occupational History  . Not on file  Social Needs  .  Financial resource strain: Not on file  . Food insecurity    Worry: Not on file    Inability: Not on file  . Transportation needs    Medical: Not on file    Non-medical: Not on file  Tobacco Use  . Smoking status: Never Smoker  . Smokeless tobacco: Never Used  Substance and Sexual Activity  . Alcohol use: Yes    Alcohol/week: 3.0 standard drinks    Types: 3 Cans of beer per week    Frequency: Never    Comment: occ   . Drug use: Yes    Types: Marijuana    Comment: Occ use  . Sexual activity: Yes  Lifestyle  . Physical activity    Days per week: Not on file    Minutes per session: Not on file  . Stress: Not on file  Relationships  . Social Herbalist on phone: Not on file    Gets together: Not on file    Attends religious service: Not on file    Active member of club or organization: Not on file    Attends meetings of clubs or organizations: Not on file    Relationship status: Not on file  Other Topics Concern  . Not on file  Social History Narrative  . Not  on file        Assessment and Plan:  See Problem List for Assessment and Plan of chronic medical problems.   Follow Up Instructions:    I discussed the assessment and treatment plan with the patient. The patient was provided an opportunity to ask questions and all were answered. The patient agreed with the plan and demonstrated an understanding of the instructions.   The patient was advised to call back or seek an in-person evaluation if the symptoms worsen or if the condition fails to improve as anticipated.  Time spent on telephone call: 10 min  Pincus Sanes, MD

## 2019-02-20 ENCOUNTER — Ambulatory Visit (INDEPENDENT_AMBULATORY_CARE_PROVIDER_SITE_OTHER): Payer: Medicare Other | Admitting: Internal Medicine

## 2019-02-20 ENCOUNTER — Encounter: Payer: Self-pay | Admitting: Internal Medicine

## 2019-02-20 ENCOUNTER — Ambulatory Visit: Payer: Medicare Other | Admitting: Internal Medicine

## 2019-02-20 DIAGNOSIS — B349 Viral infection, unspecified: Secondary | ICD-10-CM

## 2019-02-20 NOTE — Assessment & Plan Note (Signed)
His symptoms are consistent with a viral illness, possibly covid He had a covid test  - results should be back today or tomorrow Symptomatic treatment Discussed if he has any SOB, worsening of fever of any concerning symptoms he needs to call or use mychart discussed he needs to quarantine until results are back

## 2019-03-22 ENCOUNTER — Ambulatory Visit: Payer: Self-pay | Admitting: *Deleted

## 2019-03-22 NOTE — Telephone Encounter (Signed)
I returned his call using the [employer] to use a sign language interpreter.  Her #[id number].  He is c/o middle lower abd pain between his navel and penis.  It feels tight and hurts really bad.    He has not been able to have a BM in 3 days.  What little bit he could get out was hard. He is also c/o pain in his rectal area.  "I can't even sit down it hurts so bad".  "It feels like a ball up in my rectum".   It's tight and painful.   This has been going on for 3 days.   I can't even sleep at night.  I let him know he needed to go to the ED.   He was agreeable except it might be an issue with communication due to being deaf.  He asked me to call the ED at [facility] in [place] and let them know he was coming and why.   He gave me the number.   I let him know I would be glad to do that.   He asked that I call him back and let him know also if they have a video monitor to use to see an interpreter. So I called [facility] at [phone].  I let the triage nurse in the ED know who I was and why I was calling.    I gave her report and she confirmed they do have a video monitor so they could get him an interpreter. I called pt back using interpreter [phone]  and let him know I had talked with the triage nurse and let her know what was going on with you and confirmed they do have a video monitor.   He thanked me so much for my help and for calling for him.  I let him know I was glad to be able to help him.  Reason for Disposition . [1] Rectal pain or fullness from fecal impaction (rectum full of stool) AND [2] NOT better after SITZ bath, suppository or enema  Answer Assessment - Initial Assessment Questions 1. STOOL PATTERN OR FREQUENCY: "How often do you pass bowel movements (BMs)?"  (Normal range: tid to q 3 days)  "When was the last BM passed?"      Yesterday felt I needed to have BM.   Nothing.   I felt like I had to have a BM again this morning but I only urinated.    It hurts between my  bladder and prostate.   My abdomin feels tight and it feels like a big ball stuck up in me.   I'm having pain in my rectal area.  I just took a medication the doctor gave me.  Dulcolax.  Last time I took 3 pills an hour ago.    I've not tried anything else. 2. STRAINING: "Do you have to strain to have a BM?"      Yes 3. RECTAL PAIN: "Does your rectum hurt when the stool comes out?" If so, ask: "Do you have hemorrhoids? How bad is the pain?"  (Scale 1-10; or mild, moderate, severe)     I do have hemorrhoids   It feels like pain and pressure in my lower abdomin.   Even sit here I'm having a throbbing pain in my middle lower abd.   I can't sleep.  3 days I've hurt like this.      This just hit me all of a sudden. 4. STOOL COMPOSITION: "  Are the stools hard?"      Very small amt.    Yes hard.   5. BLOOD ON STOOLS: "Has there been any blood on the toilet tissue or on the surface of the BM?" If so, ask: "When was the last time?"      No 6. CHRONIC CONSTIPATION: "Is this a new problem for you?"  If no, ask: "How long have you had this problem?" (days, weeks, months)      About 15 years ago I had this pain but it went away.  No problem until now. 7. CHANGES IN DIET: "Have there been any recent changes in your diet?"      No  8. MEDICATIONS: "Have you been taking any new medications?"     No 9. LAXATIVES: "Have you been using any laxatives or enemas?"  If yes, ask "What, how often, and when was the last time?"     I took 3 Dulcolax a hour ago.      10. CAUSE: "What do you think is causing the constipation?"        I don't know  11. OTHER SYMPTOMS: "Do you have any other symptoms?" (e.g., abdominal pain, fever, vomiting)       No 12. PREGNANCY: "Is there any chance you are pregnant?" "When was your last menstrual period?"       N/A  Protocols used: CONSTIPATION-A-AH

## 2019-03-25 ENCOUNTER — Ambulatory Visit (INDEPENDENT_AMBULATORY_CARE_PROVIDER_SITE_OTHER): Payer: Medicare Other | Admitting: Internal Medicine

## 2019-03-25 ENCOUNTER — Other Ambulatory Visit (INDEPENDENT_AMBULATORY_CARE_PROVIDER_SITE_OTHER): Payer: Medicare Other

## 2019-03-25 ENCOUNTER — Other Ambulatory Visit: Payer: Self-pay

## 2019-03-25 ENCOUNTER — Encounter: Payer: Self-pay | Admitting: Internal Medicine

## 2019-03-25 VITALS — BP 110/70 | HR 70 | Temp 98.1°F | Resp 16 | Ht 70.0 in | Wt 225.0 lb

## 2019-03-25 DIAGNOSIS — N410 Acute prostatitis: Secondary | ICD-10-CM | POA: Diagnosis not present

## 2019-03-25 DIAGNOSIS — Z114 Encounter for screening for human immunodeficiency virus [HIV]: Secondary | ICD-10-CM | POA: Insufficient documentation

## 2019-03-25 NOTE — Patient Instructions (Signed)

## 2019-03-25 NOTE — Progress Notes (Signed)
Subjective:  Patient ID: Derrick Benitez, male    DOB: 06-Feb-1981  Age: 38 y.o. MRN: [id number]  CC: Urinary Tract Infection  History is obtained using interpreter [name]  This visit occurred during the SARS-CoV-2 public health emergency.  Safety protocols were in place, including screening questions prior to the visit, additional usage of staff PPE, and extensive cleaning of exam room while observing appropriate contact time as indicated for disinfecting solutions.    HPI [name] presents for he was seen in an emergency department elsewhere 2 days ago complaining of dysuria.  He was diagnosed with prostatitis and was prescribed ciprofloxacin.  He returns today for follow-up.  He continues to have dysuria but he is tolerating the ciprofloxacin well.  He denies nausea, vomiting, fever, chills, or diarrhea.  He has had some constipation recently.  Outpatient Medications Prior to Visit  Medication Sig Dispense Refill  . ciprofloxacin (CIPRO) 500 MG tablet Take 500 mg by mouth 2 (two) times daily.    . hydrocortisone (ANUSOL-HC) 2.5 % rectal cream Place 1 application rectally 2 (two) times daily. 30 g 0  . hydrocortisone (ANUSOL-HC) 25 MG suppository Place 1 suppository (25 mg total) rectally 2 (two) times daily as needed for hemorrhoids. 12 suppository 5  . methocarbamol (ROBAXIN) 500 MG tablet Take 500 mg by mouth 2 (two) times daily.    . ondansetron (ZOFRAN-ODT) 4 MG disintegrating tablet Take 4 mg by mouth every 4 (four) hours as needed.     No facility-administered medications prior to visit.    ROS Review of Systems  Constitutional: Negative.  Negative for chills, fatigue and fever.  HENT: Negative.   Eyes: Negative for visual disturbance.  Respiratory: Negative for cough and chest tightness.   Cardiovascular: Negative for chest pain, palpitations and leg swelling.  Gastrointestinal: Negative for abdominal pain, constipation, diarrhea, nausea and vomiting.  Endocrine:  Negative.   Genitourinary: Positive for dysuria. Negative for difficulty urinating, discharge, hematuria, penile swelling, scrotal swelling, testicular pain and urgency.  Musculoskeletal: Negative for arthralgias and myalgias.  Skin: Negative.  Negative for color change and rash.  Neurological: Negative.  Negative for dizziness, weakness, light-headedness and headaches.  Hematological: Negative for adenopathy. Does not bruise/bleed easily.  Psychiatric/Behavioral: Negative.     Objective:  BP 110/70 (BP Location: Left Arm, Patient Position: Sitting, Cuff Size: Normal)   Pulse 70   Temp 98.1 F (36.7 C) (Oral)   Resp 16   Ht 5\' 10"  (1.778 m)   Wt 225 lb (102.1 kg)   SpO2 96%   BMI 32.28 kg/m   BP Readings from Last 3 Encounters:  03/25/19 110/70  10/26/18 128/90  05/16/18 137/71    Wt Readings from Last 3 Encounters:  03/25/19 225 lb (102.1 kg)  10/26/18 227 lb (103 kg)  05/16/18 210 lb (95.3 kg)    Physical Exam Exam conducted with a chaperone present 07/15/18, Interpretor).  Constitutional:      Appearance: Normal appearance.  HENT:     Nose: Nose normal.     Mouth/Throat:     Mouth: Mucous membranes are moist.  Eyes:     General: No scleral icterus.    Conjunctiva/sclera: Conjunctivae normal.  Cardiovascular:     Rate and Rhythm: Normal rate and regular rhythm.     Heart sounds: No murmur.  Pulmonary:     Effort: Pulmonary effort is normal.     Breath sounds: No stridor. No wheezing, rhonchi or rales.  Abdominal:  General: Abdomen is flat. Bowel sounds are normal. There is no distension.     Palpations: Abdomen is soft. There is no hepatomegaly, splenomegaly or mass.     Tenderness: There is no abdominal tenderness. There is no guarding or rebound.     Hernia: There is no hernia in the left inguinal area or right inguinal area.  Genitourinary:    Pubic Area: No rash.      Penis: Normal and uncircumcised. No phimosis, paraphimosis, hypospadias,  erythema, tenderness, discharge, swelling or lesions.      Testes: Normal.        Right: Mass, tenderness or swelling not present.        Left: Mass, tenderness or swelling not present.     Epididymis:     Right: Normal. Not inflamed or enlarged. No mass.     Left: Normal. Not inflamed or enlarged. No mass.     Prostate: Enlarged and tender. No nodules present.     Rectum: Normal. Guaiac result negative. No mass, tenderness, anal fissure, external hemorrhoid or internal hemorrhoid. Normal anal tone.  Musculoskeletal:        General: Normal range of motion.     Cervical back: Neck supple.     Right lower leg: No edema.     Left lower leg: No edema.  Lymphadenopathy:     Cervical: No cervical adenopathy.     Lower Body: No right inguinal adenopathy. No left inguinal adenopathy.  Skin:    General: Skin is warm and dry.  Neurological:     General: No focal deficit present.     Mental Status: He is alert.  Psychiatric:        Mood and Affect: Mood normal.        Behavior: Behavior normal.     Lab Results  Component Value Date   WBC 8.0 03/28/2018   HGB 16.2 03/28/2018   HCT 47.0 03/28/2018   PLT 252.0 03/28/2018   GLUCOSE 111 (H) 03/28/2018   ALT 59 (H) 03/28/2018   AST 29 03/28/2018   NA 140 03/28/2018   K 3.6 03/28/2018   CL 105 03/28/2018   CREATININE 1.00 03/28/2018   BUN 18 03/28/2018   CO2 28 03/28/2018    MR Lumbar Spine Wo Contrast  Result Date: 12/19/2017 CLINICAL DATA:  Initial evaluation for low back pain for 2 months. EXAM: MRI LUMBAR SPINE WITHOUT CONTRAST TECHNIQUE: Multiplanar, multisequence MR imaging of the lumbar spine was performed. No intravenous contrast was administered. COMPARISON:  None. FINDINGS: Segmentation: Normal segmentation. Lowest well-formed disc labeled the L5-S1 level. Alignment: Normal alignment with preservation of the normal lumbar lordosis. No listhesis. Vertebrae: Vertebral body heights well maintained without evidence for acute or  chronic fracture. Bone marrow signal intensity normal. No discrete or worrisome osseous lesions. No abnormal marrow edema. Conus medullaris and cauda equina: Conus extends to the T12-L1 level. Conus and cauda equina appear normal. Paraspinal and other soft tissues: Paraspinous soft tissues within normal limits. Visualized visceral structures normal. Disc levels: No significant findings seen above the L3-4 level. L4-5: Normal interspace. Mild bilateral facet hypertrophy. Resultant mild bilateral lateral recess narrowing without spinal stenosis. Foramina widely patent. L5-S1: Negative interspace. Mild facet hypertrophy. No significant stenosis. IMPRESSION: 1. Mild bilateral facet hypertrophy at L4-5 and L5-S1. 2. Otherwise unremarkable MRI of the lumbar spine. Electronically Signed   By: Rise MuBenjamin  McClintock M.D.   On: 12/19/2017 23:12    Assessment & Plan:   [name] was seen today for urinary  tract infection.  Diagnoses and all orders for this visit:  Prostatitis, acute- I agree that his symptoms and exam are consistent with acute bacterial prostatitis.  I will complete the work-up by screening him for gonorrhea and chlamydia.  I recommended that he go ahead and complete the course of ciprofloxacin. -     Urinalysis, Routine w reflex microscopic; Future -     GC/Chlamydia Probe Amp; Future  Encounter for screening for HIV -     HIV antibody (Reflex); Future   I am having Kellan Raffield maintain his hydrocortisone, hydrocortisone, ciprofloxacin, methocarbamol, and ondansetron.  No orders of the defined types were placed in this encounter.    Follow-up: Return in about 6 weeks (around 05/06/2019).  Scarlette Calico, MD

## 2019-03-26 LAB — URINALYSIS, ROUTINE W REFLEX MICROSCOPIC
Bilirubin Urine: NEGATIVE
Hgb urine dipstick: NEGATIVE
Ketones, ur: NEGATIVE
Leukocytes,Ua: NEGATIVE
Nitrite: NEGATIVE
RBC / HPF: NONE SEEN (ref 0–?)
Specific Gravity, Urine: >=1.03 — AB (ref 1.000–1.030)
Total Protein, Urine: NEGATIVE
Urine Glucose: NEGATIVE
Urobilinogen, UA: 0.2 (ref 0.0–1.0)
WBC, UA: NONE SEEN (ref 0–?)
pH: 6 (ref 5.0–8.0)

## 2019-03-26 LAB — HIV ANTIBODY (ROUTINE TESTING W REFLEX): HIV 1&2 Ab, 4th Generation: NONREACTIVE

## 2019-03-27 ENCOUNTER — Encounter: Payer: Self-pay | Admitting: Internal Medicine

## 2019-03-27 LAB — GC/CHLAMYDIA PROBE AMP
Chlamydia trachomatis, NAA: NEGATIVE
Neisseria Gonorrhoeae by PCR: NEGATIVE

## 2020-01-03 DIAGNOSIS — Z23 Encounter for immunization: Secondary | ICD-10-CM | POA: Diagnosis not present

## 2020-01-04 DIAGNOSIS — N485 Ulcer of penis: Secondary | ICD-10-CM | POA: Diagnosis not present

## 2020-01-16 ENCOUNTER — Ambulatory Visit (INDEPENDENT_AMBULATORY_CARE_PROVIDER_SITE_OTHER): Payer: Medicare Other | Admitting: Internal Medicine

## 2020-01-16 ENCOUNTER — Encounter: Payer: Self-pay | Admitting: Internal Medicine

## 2020-01-16 ENCOUNTER — Other Ambulatory Visit: Payer: Self-pay

## 2020-01-16 ENCOUNTER — Other Ambulatory Visit (INDEPENDENT_AMBULATORY_CARE_PROVIDER_SITE_OTHER): Payer: Medicare Other

## 2020-01-16 VITALS — BP 124/76 | HR 83 | Temp 98.7°F | Ht 70.0 in | Wt 214.0 lb

## 2020-01-16 DIAGNOSIS — R300 Dysuria: Secondary | ICD-10-CM | POA: Insufficient documentation

## 2020-01-16 DIAGNOSIS — R10811 Right upper quadrant abdominal tenderness: Secondary | ICD-10-CM

## 2020-01-16 DIAGNOSIS — A510 Primary genital syphilis: Secondary | ICD-10-CM

## 2020-01-16 DIAGNOSIS — N485 Ulcer of penis: Secondary | ICD-10-CM

## 2020-01-16 LAB — CBC WITH DIFFERENTIAL/PLATELET
Basophils Absolute: 0 10*3/uL (ref 0.0–0.1)
Basophils Relative: 0.7 % (ref 0.0–3.0)
Eosinophils Absolute: 0.1 10*3/uL (ref 0.0–0.7)
Eosinophils Relative: 1.2 % (ref 0.0–5.0)
HCT: 44.5 % (ref 39.0–52.0)
Hemoglobin: 15.2 g/dL (ref 13.0–17.0)
Lymphocytes Relative: 23.5 % (ref 12.0–46.0)
Lymphs Abs: 1.7 10*3/uL (ref 0.7–4.0)
MCHC: 34.2 g/dL (ref 30.0–36.0)
MCV: 85.8 fl (ref 78.0–100.0)
Monocytes Absolute: 1.2 10*3/uL — ABNORMAL HIGH (ref 0.1–1.0)
Monocytes Relative: 16.4 % — ABNORMAL HIGH (ref 3.0–12.0)
Neutro Abs: 4.2 10*3/uL (ref 1.4–7.7)
Neutrophils Relative %: 58.2 % (ref 43.0–77.0)
Platelets: 298 10*3/uL (ref 150.0–400.0)
RBC: 5.19 Mil/uL (ref 4.22–5.81)
RDW: 12.9 % (ref 11.5–15.5)
WBC: 7.2 10*3/uL (ref 4.0–10.5)

## 2020-01-16 NOTE — Progress Notes (Signed)
Subjective:  Patient ID: Derrick Benitez, male    DOB: 08/21/1980  Age: 39 y.o. MRN: [id number]  CC: Abdominal Pain  This visit occurred during the SARS-CoV-2 public health emergency.  Safety protocols were in place, including screening questions prior to the visit, additional usage of staff PPE, and extensive cleaning of exam room while observing appropriate contact time as indicated for disinfecting solutions.   History is obtained using a sign language interpreter.  HPI Derrick Benitez presents for f/up - For the last week or 2 he has felt poorly.  He noticed an ulcer on his penis that is somewhat painful.  He is also had a burning sensation in his stomach.  He tells me he was seen at an urgent care center and was told to use viscous lidocaine on the ulcer but that has not helped.  Outpatient Medications Prior to Visit  Medication Sig Dispense Refill  . ciprofloxacin (CIPRO) 500 MG tablet Take 500 mg by mouth 2 (two) times daily.    . hydrocortisone (ANUSOL-HC) 2.5 % rectal cream Place 1 application rectally 2 (two) times daily. 30 g 0  . hydrocortisone (ANUSOL-HC) 25 MG suppository Place 1 suppository (25 mg total) rectally 2 (two) times daily as needed for hemorrhoids. 12 suppository 5  . methocarbamol (ROBAXIN) 500 MG tablet Take 500 mg by mouth 2 (two) times daily.    . ondansetron (ZOFRAN-ODT) 4 MG disintegrating tablet Take 4 mg by mouth every 4 (four) hours as needed.     No facility-administered medications prior to visit.    ROS Review of Systems  Constitutional: Negative for appetite change, chills, fatigue and fever.  HENT: Negative for sore throat and trouble swallowing.   Eyes: Negative.  Negative for visual disturbance.  Respiratory: Negative for cough, chest tightness, shortness of breath and wheezing.   Cardiovascular: Negative for chest pain, palpitations and leg swelling.  Gastrointestinal: Positive for abdominal pain. Negative for blood in stool, constipation, diarrhea,  nausea and vomiting.  Endocrine: Negative.   Genitourinary: Positive for dysuria, genital sores and penile pain. Negative for decreased urine volume, difficulty urinating, discharge, hematuria, penile swelling, scrotal swelling, testicular pain and urgency.  Musculoskeletal: Negative for arthralgias, myalgias and neck pain.  Skin: Negative.  Negative for color change and rash.  Neurological: Negative.   Hematological: Positive for adenopathy. Does not bruise/bleed easily.  Psychiatric/Behavioral: Negative for confusion, dysphoric mood and sleep disturbance. The patient is nervous/anxious.     Objective:  BP 124/76   Pulse 83   Temp 98.7 F (37.1 C) (Oral)   Ht 5\' 10"  (1.778 m)   Wt 214 lb (97.1 kg)   SpO2 96%   BMI 30.71 kg/m   BP Readings from Last 3 Encounters:  01/20/20 122/82  01/16/20 124/76  03/25/19 110/70    Wt Readings from Last 3 Encounters:  01/20/20 214 lb (97.1 kg)  01/16/20 214 lb (97.1 kg)  03/25/19 225 lb (102.1 kg)    Physical Exam Exam conducted with a chaperone present 03/27/19, Int.).  Constitutional:      Appearance: He is not ill-appearing.  HENT:     Nose: Nose normal.     Mouth/Throat:     Mouth: Mucous membranes are moist.  Eyes:     General: No scleral icterus.    Conjunctiva/sclera: Conjunctivae normal.  Cardiovascular:     Rate and Rhythm: Normal rate.     Pulses: Normal pulses.     Heart sounds: No murmur heard.   Pulmonary:  Effort: Pulmonary effort is normal.     Breath sounds: No stridor. No wheezing, rhonchi or rales.  Abdominal:     General: Abdomen is protuberant. There is no distension.     Tenderness: There is abdominal tenderness in the right upper quadrant. There is no guarding or rebound. Negative signs include Murphy's sign.     Hernia: No hernia is present. There is no hernia in the left inguinal area or right inguinal area.  Genitourinary:    Pubic Area: No rash.      Penis: Uncircumcised. Lesions present. No  phimosis, paraphimosis, erythema, discharge or swelling.      Testes: Normal.        Right: Mass, tenderness or swelling not present.        Left: Mass, tenderness or swelling not present.     Epididymis:     Right: Normal. Not inflamed or enlarged. No mass.     Left: Normal. Not inflamed or enlarged. No mass.     Prostate: Normal. Not enlarged, not tender and no nodules present.     Rectum: Normal. Guaiac result negative. No mass, tenderness, anal fissure, external hemorrhoid or internal hemorrhoid. Normal anal tone.     Comments: At the base of the frenulum on both sides there are small, superficial ulcers.  There are no vesicles or pustules.  There is no exudate over the glans. Musculoskeletal:     Cervical back: Neck supple.  Lymphadenopathy:     Head:     Right side of head: No occipital adenopathy.     Left side of head: No occipital adenopathy.     Cervical: No cervical adenopathy.     Upper Body:     Right upper body: No supraclavicular adenopathy.     Left upper body: No supraclavicular adenopathy.     Lower Body: Right inguinal adenopathy present. Left inguinal adenopathy present.  Neurological:     Mental Status: He is alert.     Lab Results  Component Value Date   WBC 7.2 01/16/2020   HGB 15.2 01/16/2020   HCT 44.5 01/16/2020   PLT 298.0 01/16/2020   GLUCOSE 85 01/16/2020   ALT 81 (H) 01/16/2020   AST 35 01/16/2020   NA 139 01/16/2020   K 4.1 01/16/2020   CL 102 01/16/2020   CREATININE 1.05 01/16/2020   BUN 20 01/16/2020   CO2 24 01/16/2020    MR Lumbar Spine Wo Contrast  Result Date: 12/19/2017 CLINICAL DATA:  Initial evaluation for low back pain for 2 months. EXAM: MRI LUMBAR SPINE WITHOUT CONTRAST TECHNIQUE: Multiplanar, multisequence MR imaging of the lumbar spine was performed. No intravenous contrast was administered. COMPARISON:  None. FINDINGS: Segmentation: Normal segmentation. Lowest well-formed disc labeled the L5-S1 level. Alignment: Normal  alignment with preservation of the normal lumbar lordosis. No listhesis. Vertebrae: Vertebral body heights well maintained without evidence for acute or chronic fracture. Bone marrow signal intensity normal. No discrete or worrisome osseous lesions. No abnormal marrow edema. Conus medullaris and cauda equina: Conus extends to the T12-L1 level. Conus and cauda equina appear normal. Paraspinal and other soft tissues: Paraspinous soft tissues within normal limits. Visualized visceral structures normal. Disc levels: No significant findings seen above the L3-4 level. L4-5: Normal interspace. Mild bilateral facet hypertrophy. Resultant mild bilateral lateral recess narrowing without spinal stenosis. Foramina widely patent. L5-S1: Negative interspace. Mild facet hypertrophy. No significant stenosis. IMPRESSION: 1. Mild bilateral facet hypertrophy at L4-5 and L5-S1. 2. Otherwise unremarkable MRI of the lumbar  spine. Electronically Signed   By: Rise Mu M.D.   On: 12/19/2017 23:12    Assessment & Plan:   Tanya was seen today for abdominal pain.  Diagnoses and all orders for this visit:  Right upper quadrant abdominal tenderness without rebound tenderness- Based on his symptoms and exam I do not think he has an acute abdominal process. He has mildly elevated liver enzymes but this is not new for him. I have asked him to undergo an ultrasound to see if there is NASH or cholelithiasis. -     Cancel: CBC with Differential/Platelet; Future -     Cancel: Lipase; Future -     Cancel: Hepatic function panel; Future -     Cancel: Urinalysis, Routine w reflex microscopic; Future -     CBC with Differential/Platelet; Future -     BASIC METABOLIC PANEL WITH GFR; Future -     Lipase; Future -     Urinalysis, Routine w reflex microscopic; Future -     Hepatic function panel; Future -     US Abdomen Limited RUQ; Future -     US Abdomen Limited RUQ; Future  Penile ulcer- I will screen for herpes, gonorrhea,  chlamydia, and syphilis. -     HSV(herpes smplx)abs-1+2(IgG+IgM)-bld; Future -     Cancel: RPR; Future -     Urinalysis, Routine w reflex microscopic; Future -     Chlamydia/Gonococcus/Trichomonas, NAA; Future -     RPR; Future -     HSV(herpes simplex vrs) 1+2 ab-IgM; Future  Dysuria-his UA is normal. I will screen for gonorrhea and chlamydia. 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   I am having Ajene Carchi maintain his hydrocortisone, hydrocortisone, ciprofloxacin, methocarbamol, and ondansetron.  No orders of the defined types were placed in this encounter.    Follow-up: Return in about 3 weeks (around 02/06/2020).  Sanda Linger, MD

## 2020-01-16 NOTE — Patient Instructions (Signed)
Abdominal Pain, Adult Pain in the abdomen (abdominal pain) can be caused by many things. Often, abdominal pain is not serious and it gets better with no treatment or by being treated at home. However, sometimes abdominal pain is serious. Your health care provider will ask questions about your medical history and do a physical exam to try to determine the cause of your abdominal pain. Follow these instructions at home:  Medicines  Take over-the-counter and prescription medicines only as told by your health care provider.  Do not take a laxative unless told by your health care provider. General instructions  Watch your condition for any changes.  Drink enough fluid to keep your urine pale yellow.  Keep all follow-up visits as told by your health care provider. This is important. Contact a health care provider if:  Your abdominal pain changes or gets worse.  You are not hungry or you lose weight without trying.  You are constipated or have diarrhea for more than 2-3 days.  You have pain when you urinate or have a bowel movement.  Your abdominal pain wakes you up at night.  Your pain gets worse with meals, after eating, or with certain foods.  You are vomiting and cannot keep anything down.  You have a fever.  You have blood in your urine. Get help right away if:  Your pain does not go away as soon as your health care provider told you to expect.  You cannot stop vomiting.  Your pain is only in areas of the abdomen, such as the right side or the left lower portion of the abdomen. Pain on the right side could be caused by appendicitis.  You have bloody or black stools, or stools that look like tar.  You have severe pain, cramping, or bloating in your abdomen.  You have signs of dehydration, such as: ? Dark urine, very little urine, or no urine. ? Cracked lips. ? Dry mouth. ? Sunken eyes. ? Sleepiness. ? Weakness.  You have trouble breathing or chest  pain. Summary  Often, abdominal pain is not serious and it gets better with no treatment or by being treated at home. However, sometimes abdominal pain is serious.  Watch your condition for any changes.  Take over-the-counter and prescription medicines only as told by your health care provider.  Contact a health care provider if your abdominal pain changes or gets worse.  Get help right away if you have severe pain, cramping, or bloating in your abdomen. This information is not intended to replace advice given to you by your health care provider. Make sure you discuss any questions you have with your health care provider. Document Revised: 08/06/2018 Document Reviewed: 08/06/2018 Elsevier Patient Education  2020 Elsevier Inc.  

## 2020-01-17 ENCOUNTER — Other Ambulatory Visit: Payer: Self-pay | Admitting: Internal Medicine

## 2020-01-17 DIAGNOSIS — A510 Primary genital syphilis: Secondary | ICD-10-CM | POA: Insufficient documentation

## 2020-01-17 DIAGNOSIS — N485 Ulcer of penis: Secondary | ICD-10-CM | POA: Diagnosis not present

## 2020-01-17 DIAGNOSIS — R300 Dysuria: Secondary | ICD-10-CM | POA: Diagnosis not present

## 2020-01-17 LAB — URINALYSIS, ROUTINE W REFLEX MICROSCOPIC
Bilirubin Urine: NEGATIVE
Leukocytes,Ua: NEGATIVE
Nitrite: NEGATIVE
RBC / HPF: NONE SEEN (ref 0–?)
Specific Gravity, Urine: 1.025 (ref 1.000–1.030)
Total Protein, Urine: NEGATIVE
Urine Glucose: NEGATIVE
Urobilinogen, UA: 1 (ref 0.0–1.0)
WBC, UA: NONE SEEN (ref 0–?)
pH: 6 (ref 5.0–8.0)

## 2020-01-17 LAB — RPR: RPR Ser Ql: REACTIVE — AB

## 2020-01-17 LAB — BASIC METABOLIC PANEL WITH GFR
BUN: 20 mg/dL (ref 7–25)
CO2: 24 mmol/L (ref 20–32)
Calcium: 8.7 mg/dL (ref 8.6–10.3)
Chloride: 102 mmol/L (ref 98–110)
Creat: 1.05 mg/dL (ref 0.60–1.35)
GFR, Est African American: 103 mL/min/{1.73_m2} (ref >=60)
GFR, Est Non African American: 89 mL/min/{1.73_m2} (ref >=60)
Glucose, Bld: 85 mg/dL (ref 65–99)
Potassium: 4.1 mmol/L (ref 3.5–5.3)
Sodium: 139 mmol/L (ref 135–146)

## 2020-01-17 LAB — SPECIMEN COMPROMISED

## 2020-01-17 LAB — HEPATIC FUNCTION PANEL
ALT: 81 U/L — ABNORMAL HIGH (ref 0–53)
AST: 35 U/L (ref 0–37)
Albumin: 4.1 g/dL (ref 3.5–5.2)
Alkaline Phosphatase: 93 U/L (ref 39–117)
Bilirubin, Direct: 0 mg/dL (ref 0.0–0.3)
Total Bilirubin: 0.7 mg/dL (ref 0.2–1.2)
Total Protein: 8.4 g/dL — ABNORMAL HIGH (ref 6.0–8.3)

## 2020-01-17 LAB — LIPASE: Lipase: 39 U/L (ref 11.0–59.0)

## 2020-01-17 LAB — RPR TITER: RPR Titer: 1:16 {titer} — ABNORMAL HIGH

## 2020-01-17 LAB — FLUORESCENT TREPONEMAL AB(FTA)-IGG-BLD: Fluorescent Treponemal ABS: REACTIVE — AB

## 2020-01-17 NOTE — Progress Notes (Signed)
Per [facility] order entered w/wrong location. Re-enter w/correct location.Marland Kitchenlmb

## 2020-01-20 ENCOUNTER — Telehealth: Payer: Self-pay | Admitting: *Deleted

## 2020-01-20 ENCOUNTER — Other Ambulatory Visit: Payer: Self-pay

## 2020-01-20 ENCOUNTER — Encounter: Payer: Self-pay | Admitting: Internal Medicine

## 2020-01-20 ENCOUNTER — Ambulatory Visit (INDEPENDENT_AMBULATORY_CARE_PROVIDER_SITE_OTHER): Payer: Medicare Other | Admitting: Internal Medicine

## 2020-01-20 VITALS — BP 122/82 | HR 77 | Temp 98.1°F | Resp 16 | Ht 70.0 in | Wt 214.0 lb

## 2020-01-20 DIAGNOSIS — A510 Primary genital syphilis: Secondary | ICD-10-CM

## 2020-01-20 DIAGNOSIS — A6002 Herpesviral infection of other male genital organs: Secondary | ICD-10-CM

## 2020-01-20 DIAGNOSIS — Z114 Encounter for screening for human immunodeficiency virus [HIV]: Secondary | ICD-10-CM

## 2020-01-20 LAB — CHLAMYDIA/GONOCOCCUS/TRICHOMONAS, NAA
Chlamydia by NAA: NEGATIVE
Gonococcus by NAA: NEGATIVE
Trich vag by NAA: NEGATIVE

## 2020-01-20 MED ORDER — PENICILLIN G BENZATHINE 1200000 UNIT/2ML IM SUSP
1.2000 10*6.[IU] | Freq: Once | INTRAMUSCULAR | Status: AC
  Administered 2020-01-20: 1.2 10*6.[IU] via INTRAMUSCULAR

## 2020-01-20 NOTE — Patient Instructions (Signed)
Syphilis Syphilis is an infection that can spread through sexual contact. The infection can cause serious complications, so it is important to get treatment right away. There are four stages of syphilis:  Primary stage. During this stage sores may form where the disease entered your body.  Secondary stage. During this stage skin rashes and lesions will form.  Latent stage. During this stage there are no symptoms, but the infection may still be contagious.  Tertiary stage. This stage happens 10-30 years after the infection starts. During this stage, the disease damages organs and can lead to death. Most people do not develop this stage of syphilis. What are the causes? This condition is caused by bacteria called Treponema pallidum. The condition can spread during sexual activity, such as during oral, anal, or vaginal sex. It can also be spread from mother to fetus during pregnancy. What increases the risk? You are more likely to develop this condition if:  You do not use a condom.  You have or had another sexually transmitted infection (STI).  You have multiple sex partners.  You use illegal drugs through an IV. What are the signs or symptoms? Symptoms of this condition depend on the stage of the disease. Primary stage  Painless sores (chancres) in and around the genital organs, mouth, or hands. The sores are usually firm and round. Secondary stage  A rash or sores. The rash usually does not itch.  A fever.  A headache.  A sore throat.  Swollen lymph nodes.  New sores in the mouth or on the genitals.  A feeling of being ill.  Pain in the joints.  Patchy hair loss.  Weight loss.  Fatigue. Latent stage There are no symptoms during this stage. Tertiary stage  Dementia.  Personality and mood changes.  Difficulty walking and coordinating movements.  Muscle weakness or paralysis.  Problems with coordination.  Heart failure.  Trouble  breathing.  Fainting.  Soft, rubbery growths on the skin, bones, or liver (gummas).  Sudden "lightning" pains, numbness, or tingling.  Vision changes.  Hearing changes.  Trouble controlling your urine and bowel movements. How is this diagnosed? This condition is diagnosed with:  A physical exam.  Blood tests.  Tests of the fluid (drainage) from a sore or rash.  Tests of the fluid around the spine (lumbar puncture). These tests are done to check for an infection in the brain or nervous system.  Imaging tests. These may be done to check for damage to the heart, aorta, or brain if the condition is in the tertiary stage. Tests may include: ? An X-ray. ? A CT scan. ? An MRI. ? An echocardiogram. This test takes a picture of the heart. ? An ultrasound. How is this treated? This condition can be cured with antibiotic medicine. During the first day of treatment, the medicine may cause you to experience fever, chills, headache, nausea, or aching all over your body. This is normal and should go away within 24 hours. Follow these instructions at home: Medicines   Take over-the-counter and prescription medicines only as told by your health care provider.  Take your antibiotic medicine as told by your health care provider. Do not stop taking the antibiotic even if you start to feel better. Incomplete treatment will put you at risk for continued infection and could be life threatening. General instructions  Do not have sex until your treatment is completed, or as directed by your health care provider.  Tell your recent sexual partners that you   were diagnosed with syphilis. It is important that they get treatment, even if they do not have symptoms.  Keep all follow-up visits as told by your health care provider. This is important. How is this prevented?  Use latex condoms correctly whenever you have sex.  Before you have sex, ask your partner if he or she has been tested for STIs.  Ask about the test results.  Avoid having multiple sexual partners. Contact a health care provider if:  You continue to have any of the following symptoms 24 hours after beginning treatment: ? Fever. ? Chills. ? Headache. ? Nausea. ? Aching all over your body.  Your symptoms do not improve, even with treatment. Get help right away if:  You have severe chest pain.  You have trouble walking or coordinating movements.  You are confused.  You lose vision or hearing.  You have numbness in your arms or legs.  You have a seizure.  You faint.  You have a severe headache that does not go away with medicine. Summary  Syphilis is an infection that can spread through sexual contact.  This condition can cause serious complications, so it is best to get treatment right away. The condition can be cured with antibiotic medicine.  This condition can also be spread from mother to fetus during pregnancy.  Take your antibiotic medicine as told by your health care provider.  Tell your recent sexual partners that you were diagnosed with syphilis. It is important that they get treatment, even if they do not have symptoms. This information is not intended to replace advice given to you by your health care provider. Make sure you discuss any questions you have with your health care provider. Document Revised: 10/09/2018 Document Reviewed: 05/24/2016 Elsevier Patient Education  2020 Elsevier Inc.  

## 2020-01-20 NOTE — Telephone Encounter (Signed)
Rec'd call from Aniwa w/[facility] he states they received a lab order for pt testing post ive for Syphilis. Wanting to know has the pt been contacted or have he received any kind of treatment. Inform Corey per chart pt had a ov this am. MD did give injection of Penicillin g benzathine. He also wanted to verify pt number and address on file.Marland KitchenRaechel Chute

## 2020-01-20 NOTE — Progress Notes (Signed)
Subjective:  Patient ID: Derrick Benitez, male    DOB: 1980-12-02  Age: 39 y.o. MRN: [id number]  CC: Follow-up  This visit occurred during the SARS-CoV-2 public health emergency.  Safety protocols were in place, including screening questions prior to the visit, additional usage of staff PPE, and extensive cleaning of exam room while observing appropriate contact time as indicated for disinfecting solutions.    HPI Derrick Benitez presents for f/up - I saw him about 4 days ago with a complaint of ulcers on his penis.  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  Outpatient Medications Prior to Visit  Medication Sig Dispense Refill  . ciprofloxacin (CIPRO) 500 MG tablet Take 500 mg by mouth 2 (two) times daily.    . hydrocortisone (ANUSOL-HC) 2.5 % rectal cream Place 1 application rectally 2 (two) times daily. 30 g 0  . hydrocortisone (ANUSOL-HC) 25 MG suppository Place 1 suppository (25 mg total) rectally 2 (two) times daily as needed for hemorrhoids. 12 suppository 5  . methocarbamol (ROBAXIN) 500 MG tablet Take 500 mg by mouth 2 (two) times daily.    . ondansetron (ZOFRAN-ODT) 4 MG disintegrating tablet Take 4 mg by mouth every 4 (four) hours as needed.     No facility-administered medications prior to visit.    ROS Review of Systems  Constitutional: Negative.  Negative for chills, fatigue and fever.  HENT: Negative.  Negative for sore throat and trouble swallowing.   Eyes: Negative.   Respiratory: Negative for cough, chest tightness, shortness of breath and wheezing.   Cardiovascular: Negative for chest pain, palpitations and leg swelling.  Gastrointestinal: Negative for abdominal pain, constipation, diarrhea, nausea and vomiting.  Endocrine: Negative.   Genitourinary: Positive for genital sores. Negative for difficulty urinating, dysuria, hematuria, penile pain, testicular pain and urgency.  Musculoskeletal: Negative.  Negative for arthralgias and myalgias.  Skin: Negative.   Negative for rash.  Neurological: Negative.   Hematological: Negative for adenopathy. Does not bruise/bleed easily.  Psychiatric/Behavioral: Negative.     Objective:  BP 122/82   Pulse 77   Temp 98.1 F (36.7 C) (Oral)   Resp 16   Ht 5\' 10"  (1.778 m)   Wt 214 lb (97.1 kg)   SpO2 97%   BMI 30.71 kg/m   BP Readings from Last 3 Encounters:  01/20/20 122/82  01/16/20 124/76  03/25/19 110/70    Wt Readings from Last 3 Encounters:  01/20/20 214 lb (97.1 kg)  01/16/20 214 lb (97.1 kg)  03/25/19 225 lb (102.1 kg)    Physical Exam Vitals reviewed.  HENT:     Mouth/Throat:     Mouth: Mucous membranes are moist.  Eyes:     General: No scleral icterus.    Conjunctiva/sclera: Conjunctivae normal.  Cardiovascular:     Rate and Rhythm: Normal rate and regular rhythm.     Heart sounds: No murmur heard.   Pulmonary:     Effort: Pulmonary effort is normal.     Breath sounds: No stridor. No wheezing, rhonchi or rales.  Abdominal:     General: Abdomen is flat. Bowel sounds are normal. There is no distension.     Palpations: Abdomen is soft. There is no hepatomegaly, splenomegaly or mass.     Tenderness: There is no abdominal tenderness.  Musculoskeletal:        General: Normal range of motion.     Cervical back: Neck supple.     Right lower leg: No edema.  Left lower leg: No edema.  Lymphadenopathy:     Lower Body: Right inguinal adenopathy present. Left inguinal adenopathy present.  Skin:    General: Skin is warm and dry.     Coloration: Skin is not pale.  Neurological:     General: No focal deficit present.     Mental Status: He is alert.  Psychiatric:        Mood and Affect: Mood normal.        Behavior: Behavior normal.     Lab Results  Component Value Date   WBC 7.2 01/16/2020   HGB 15.2 01/16/2020   HCT 44.5 01/16/2020   PLT 298.0 01/16/2020   GLUCOSE 85 01/16/2020   ALT 81 (H) 01/16/2020   AST 35 01/16/2020   NA 139 01/16/2020   K 4.1 01/16/2020    CL 102 01/16/2020   CREATININE 1.05 01/16/2020   BUN 20 01/16/2020   CO2 24 01/16/2020    MR Lumbar Spine Wo Contrast  Result Date: 12/19/2017 CLINICAL DATA:  Initial evaluation for low back pain for 2 months. EXAM: MRI LUMBAR SPINE WITHOUT CONTRAST TECHNIQUE: Multiplanar, multisequence MR imaging of the lumbar spine was performed. No intravenous contrast was administered. COMPARISON:  None. FINDINGS: Segmentation: Normal segmentation. Lowest well-formed disc labeled the L5-S1 level. Alignment: Normal alignment with preservation of the normal lumbar lordosis. No listhesis. Vertebrae: Vertebral body heights well maintained without evidence for acute or chronic fracture. Bone marrow signal intensity normal. No discrete or worrisome osseous lesions. No abnormal marrow edema. Conus medullaris and cauda equina: Conus extends to the T12-L1 level. Conus and cauda equina appear normal. Paraspinal and other soft tissues: Paraspinous soft tissues within normal limits. Visualized visceral structures normal. Disc levels: No significant findings seen above the L3-4 level. L4-5: Normal interspace. Mild bilateral facet hypertrophy. Resultant mild bilateral lateral recess narrowing without spinal stenosis. Foramina widely patent. L5-S1: Negative interspace. Mild facet hypertrophy. No significant stenosis. IMPRESSION: 1. Mild bilateral facet hypertrophy at L4-5 and L5-S1. 2. Otherwise unremarkable MRI of the lumbar spine. Electronically Signed   By: Rise Mu M.D.   On: 12/19/2017 23:12    Assessment & Plan:   Yorel was seen today for follow-up.  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  He received 2,400,000 units of penicillin G benzathine today.  He is HIV negative. -     penicillin g benzathine (BICILLIN LA) 1200000 UNIT/2ML injection 1.2 Million Units  Encounter for screening for HIV -     HIV Antibody (routine  testing w rflx); Future -     HIV Antibody (routine testing w rflx)  Herpes genitalis in men -     valACYclovir (VALTREX) 1000 MG tablet; Take 1 tablet (1,000 mg total) by mouth 2 (two) times daily for 7 days.   I am having Shean Gerding start on valACYclovir. I am also having him maintain his hydrocortisone, hydrocortisone, ciprofloxacin, methocarbamol, and ondansetron. We administered penicillin g benzathine.  Meds ordered this encounter  Medications  . penicillin g benzathine (BICILLIN LA) 1200000 UNIT/2ML injection 1.2 Million Units    Order Specific Question:   Antibiotic Indication:    Answer:   Syphilis  . valACYclovir (VALTREX) 1000 MG tablet    Sig: Take 1 tablet (1,000 mg total) by mouth 2 (two) times daily for 7 days.    Dispense:  14 tablet    Refill:  0     Follow-up:  Return in about 3 months (around 04/21/2020).  Sanda Linger, MD

## 2020-01-21 ENCOUNTER — Ambulatory Visit: Payer: Medicare Other | Admitting: Internal Medicine

## 2020-01-21 ENCOUNTER — Encounter: Payer: Self-pay | Admitting: Internal Medicine

## 2020-01-21 DIAGNOSIS — A6002 Herpesviral infection of other male genital organs: Secondary | ICD-10-CM | POA: Insufficient documentation

## 2020-01-21 DIAGNOSIS — L500 Allergic urticaria: Secondary | ICD-10-CM | POA: Diagnosis not present

## 2020-01-21 LAB — HSV(HERPES SMPLX)ABS-I+II(IGG+IGM)-BLD
HSV 1 Glycoprotein G Ab, IgG: 29.1 index — ABNORMAL HIGH (ref 0.00–0.90)
HSV 2 IgG, Type Spec: <0.91 index (ref 0.00–0.90)
HSVI/II Comb IgM: 1.39 Ratio — ABNORMAL HIGH (ref 0.00–0.90)

## 2020-01-21 LAB — HIV ANTIBODY (ROUTINE TESTING W REFLEX): HIV 1&2 Ab, 4th Generation: NONREACTIVE

## 2020-01-21 MED ORDER — VALACYCLOVIR HCL 1 G PO TABS: 1000.0000 mg | ORAL_TABLET | Freq: Two times a day (BID) | ORAL | 0 refills | Status: DC

## 2020-01-22 ENCOUNTER — Other Ambulatory Visit: Payer: Self-pay | Admitting: Internal Medicine

## 2020-01-22 ENCOUNTER — Encounter: Payer: Self-pay | Admitting: Internal Medicine

## 2020-01-22 ENCOUNTER — Telehealth: Payer: Self-pay | Admitting: Internal Medicine

## 2020-01-22 NOTE — Telephone Encounter (Signed)
Patient called and he said that he is having a rash all over his body and he went to the ER yesterday and they gave him two shots and he got valACYclovir (VALTREX) 1000 MG tablet  He said that he is itching again and it didn't start until after taking the above medication. He states that after taking it the rash and itching started.    Please call the patient back, he does need an interpreter. [phone]

## 2020-01-22 NOTE — Telephone Encounter (Signed)
Patient is wondering what he can take instead of valtrex If interperter does not answer it is okay to leave a message [phone] [id number] Rosalita Levan, [place] - [address] AT [facility] [address] Phone:  [phone]  Fax:  [phone]

## 2020-01-22 NOTE — Telephone Encounter (Signed)
The number for the interpreter is [phone].

## 2020-01-22 NOTE — Telephone Encounter (Signed)
    Patient/ Interpreter calling, what medication can be used instead of Valtrex

## 2020-01-23 ENCOUNTER — Other Ambulatory Visit: Payer: Self-pay | Admitting: Internal Medicine

## 2020-01-23 DIAGNOSIS — T7840XD Allergy, unspecified, subsequent encounter: Secondary | ICD-10-CM

## 2020-01-23 MED ORDER — METHYLPREDNISOLONE 4 MG PO TBPK: ORAL_TABLET | ORAL | 0 refills | Status: AC

## 2020-01-23 MED ORDER — HYDROXYZINE HCL 25 MG PO TABS: 25.0000 mg | ORAL_TABLET | Freq: Three times a day (TID) | ORAL | 0 refills | Status: DC | PRN

## 2020-01-23 NOTE — Telephone Encounter (Signed)
error 

## 2020-01-23 NOTE — Telephone Encounter (Signed)
Patient called again stating he is still itching so bad he is now not able to even sit down. Explained to patient Dr. Yetta Barre had stated no other medication at this time but patient still asking for something else.

## 2020-01-23 NOTE — Telephone Encounter (Signed)
Explained that, per Dr Yetta Barre, there is not an alternate medication to take at this time.  Interpreter verb understanding.

## 2020-01-23 NOTE — Telephone Encounter (Signed)
Called pt, LVM with details below  

## 2020-01-30 ENCOUNTER — Ambulatory Visit
Admission: RE | Admit: 2020-01-30 | Discharge: 2020-01-30 | Disposition: A | Discharge status: Home / Self Care | Payer: Medicare Other | Source: Ambulatory Visit | Attending: Internal Medicine | Admitting: Internal Medicine

## 2020-01-30 ENCOUNTER — Other Ambulatory Visit: Payer: Self-pay

## 2020-01-30 DIAGNOSIS — R10811 Right upper quadrant abdominal tenderness: Secondary | ICD-10-CM

## 2020-01-30 DIAGNOSIS — K760 Fatty (change of) liver, not elsewhere classified: Secondary | ICD-10-CM | POA: Diagnosis not present

## 2020-02-03 ENCOUNTER — Telehealth: Payer: Self-pay | Admitting: Internal Medicine

## 2020-02-03 ENCOUNTER — Other Ambulatory Visit: Payer: Self-pay | Admitting: Internal Medicine

## 2020-02-03 DIAGNOSIS — Z23 Encounter for immunization: Secondary | ICD-10-CM | POA: Diagnosis not present

## 2020-02-03 DIAGNOSIS — K7581 Nonalcoholic steatohepatitis (NASH): Secondary | ICD-10-CM | POA: Insufficient documentation

## 2020-02-03 MED ORDER — PIOGLITAZONE HCL 15 MG PO TABS
15.0000 mg | ORAL_TABLET | Freq: Every day | ORAL | 1 refills | Status: DC
Start: 2020-02-03 — End: 2020-07-30

## 2020-02-03 NOTE — Progress Notes (Unsigned)
Derrick Benitez

## 2020-02-03 NOTE — Telephone Encounter (Signed)
Patient called and stated he saw the message from Dr.Jones and that he would like to try the pioglitazone medication.  Please send to pharmacy on file.

## 2020-02-19 ENCOUNTER — Telehealth: Payer: Self-pay | Admitting: Internal Medicine

## 2020-02-19 NOTE — Telephone Encounter (Signed)
Lupita Leash with [facility] called and said that the patient is experiencing dizziness, nausea,feels off, can barley walk room to room, yesterday he said that he lost consciousness but doesn't know for how long. Lupita Leash did recommend him to call [phone] but he doesn't want to. He wants an appointment with his PCP. He can be reached at [phone]. He does need a sign language interpreter.

## 2020-02-19 NOTE — Telephone Encounter (Signed)
Spoke with PCP. He agrees with the below recommendation.   Called pt, LVM stating that he should go to the ED.

## 2020-02-26 ENCOUNTER — Ambulatory Visit: Payer: Medicare Other | Admitting: Internal Medicine

## 2020-03-11 ENCOUNTER — Encounter: Payer: Self-pay | Admitting: Internal Medicine

## 2020-03-11 ENCOUNTER — Other Ambulatory Visit: Payer: Self-pay

## 2020-03-11 ENCOUNTER — Ambulatory Visit (INDEPENDENT_AMBULATORY_CARE_PROVIDER_SITE_OTHER): Payer: Medicare Other | Admitting: Internal Medicine

## 2020-03-11 VITALS — BP 126/78 | HR 61 | Temp 97.9°F | Resp 16 | Ht 70.0 in | Wt 220.0 lb

## 2020-03-11 DIAGNOSIS — K7581 Nonalcoholic steatohepatitis (NASH): Secondary | ICD-10-CM

## 2020-03-11 DIAGNOSIS — R270 Ataxia, unspecified: Secondary | ICD-10-CM | POA: Insufficient documentation

## 2020-03-11 DIAGNOSIS — H81313 Aural vertigo, bilateral: Secondary | ICD-10-CM | POA: Insufficient documentation

## 2020-03-11 MED ORDER — DIAZEPAM 2 MG PO TABS: 2.0000 mg | ORAL_TABLET | Freq: Three times a day (TID) | ORAL | 1 refills | Status: DC | PRN

## 2020-03-11 NOTE — Progress Notes (Signed)
Subjective:  Patient ID: Derrick Benitez, male    DOB: 05-08-1980  Age: 39 y.o. MRN: [id number]  CC: Follow-up  This visit occurred during the SARS-CoV-2 public health emergency.  Safety protocols were in place, including screening questions prior to the visit, additional usage of staff PPE, and extensive cleaning of exam room while observing appropriate contact time as indicated for disinfecting solutions.    HPI Derrick Benitez presents for f/up - He complains of a 63-month history of intermittent episodes of dizziness, nausea, vomiting and a sensation that there is fluid in his ears.  He has had a mild discomfort but behind his right ear and over the left occipital region.  He is also felt off balance.  He has purchased Dramamine from the pharmacy and says it helps when he takes it.  He has these episodes every so often.  The left episode was about 2 days ago.  The symptoms were present before he started taking pioglitazone and have not changed or worsened since he started taking it.  Outpatient Medications Prior to Visit  Medication Sig Dispense Refill   hydrOXYzine (ATARAX/VISTARIL) 25 MG tablet Take 1 tablet (25 mg total) by mouth every 8 (eight) hours as needed. 30 tablet 0   pioglitazone (ACTOS) 15 MG tablet Take 1 tablet (15 mg total) by mouth daily. 90 tablet 1   No facility-administered medications prior to visit.    ROS Review of Systems  Constitutional: Negative.  Negative for diaphoresis and fatigue.  HENT: Negative.   Eyes: Negative.   Respiratory: Negative for cough, chest tightness, shortness of breath and wheezing.   Cardiovascular: Negative for chest pain, palpitations and leg swelling.  Gastrointestinal: Positive for nausea and vomiting. Negative for abdominal pain, blood in stool, constipation and diarrhea.  Endocrine: Negative.   Genitourinary: Negative.  Negative for difficulty urinating.  Musculoskeletal: Positive for gait problem. Negative for arthralgias and myalgias.   Skin: Negative.  Negative for color change, pallor and rash.  Neurological: Positive for dizziness, speech difficulty, light-headedness and headaches. Negative for tremors, seizures, syncope, facial asymmetry, weakness and numbness.  Hematological: Negative for adenopathy. Does not bruise/bleed easily.  Psychiatric/Behavioral: Negative.     Objective:  BP 126/78    Pulse 61    Temp 97.9 F (36.6 C) (Oral)    Resp 16    Ht 5\' 10"  (1.778 m)    Wt 220 lb (99.8 kg)    SpO2 98%    BMI 31.57 kg/m   BP Readings from Last 3 Encounters:  03/11/20 126/78  01/20/20 122/82  01/16/20 124/76    Wt Readings from Last 3 Encounters:  03/11/20 220 lb (99.8 kg)  01/20/20 214 lb (97.1 kg)  01/16/20 214 lb (97.1 kg)    Physical Exam Vitals reviewed.  Constitutional:      Appearance: Normal appearance.  HENT:     Nose: Nose normal.     Mouth/Throat:     Mouth: Mucous membranes are moist.  Eyes:     General: No scleral icterus.    Extraocular Movements: Extraocular movements intact.     Conjunctiva/sclera: Conjunctivae normal.     Pupils: Pupils are equal, round, and reactive to light.  Cardiovascular:     Rate and Rhythm: Normal rate and regular rhythm.     Heart sounds: No murmur heard.   Pulmonary:     Effort: Pulmonary effort is normal.     Breath sounds: No stridor. No wheezing, rhonchi or rales.  Abdominal:  General: Abdomen is flat. Bowel sounds are normal. There is no distension.     Palpations: Abdomen is soft. There is no hepatomegaly, splenomegaly or mass.     Tenderness: There is no abdominal tenderness.  Musculoskeletal:        General: Normal range of motion.     Cervical back: Neck supple.     Right lower leg: No edema.     Left lower leg: No edema.  Lymphadenopathy:     Cervical: No cervical adenopathy.  Skin:    General: Skin is warm and dry.     Coloration: Skin is not pale.  Neurological:     General: No focal deficit present.     Mental Status: He is alert  and oriented to person, place, and time. Mental status is at baseline.     Cranial Nerves: Cranial nerves are intact. No dysarthria.     Motor: Motor function is intact. No weakness or atrophy.     Coordination: Romberg sign positive. Coordination normal. Finger-Nose-Finger Test and Heel to [facility] Test normal. Rapid alternating movements normal.     Deep Tendon Reflexes: Reflexes normal. Babinski sign absent on the right side. Babinski sign absent on the left side.     Reflex Scores:      Tricep reflexes are 1+ on the right side and 1+ on the left side.      Bicep reflexes are 1+ on the right side and 1+ on the left side.      Brachioradialis reflexes are 1+ on the right side and 1+ on the left side.      Patellar reflexes are 1+ on the right side and 1+ on the left side.      Achilles reflexes are 1+ on the right side and 1+ on the left side. Psychiatric:        Mood and Affect: Mood normal.        Behavior: Behavior normal.        Thought Content: Thought content normal.        Judgment: Judgment normal.     Lab Results  Component Value Date   WBC 7.2 01/16/2020   HGB 15.2 01/16/2020   HCT 44.5 01/16/2020   PLT 298.0 01/16/2020   GLUCOSE 85 01/16/2020   ALT 81 (H) 01/16/2020   AST 35 01/16/2020   NA 139 01/16/2020   K 4.1 01/16/2020   CL 102 01/16/2020   CREATININE 1.05 01/16/2020   BUN 20 01/16/2020   CO2 24 01/16/2020    US Abdomen Limited RUQ  Result Date: 01/30/2020 CLINICAL DATA:  39 year old male with right upper quadrant abdominal pain. EXAM: ULTRASOUND ABDOMEN LIMITED RIGHT UPPER QUADRANT COMPARISON:  Right upper quadrant ultrasound dated 11/01/2012. FINDINGS: Gallbladder: No gallstones or wall thickening visualized. No sonographic Murphy sign noted by sonographer. Common bile duct: Diameter: 3 mm Liver: There is diffuse increased liver echogenicity most commonly seen in the setting of fatty infiltration. Superimposed inflammation or fibrosis is not excluded. Clinical  correlation is recommended. Portal vein is patent on color Doppler imaging with normal direction of blood flow towards the liver. Other: None. IMPRESSION: Fatty liver, otherwise unremarkable right upper quadrant ultrasound. Electronically Signed   By: Elgie Collard M.D.   On: 01/30/2020 22:15    Assessment & Plan:   Rajiv was seen today for follow-up.  Diagnoses and all orders for this visit:  NASH (nonalcoholic steatohepatitis)- Will continue pioglitazone.  Vertigo, aural, bilateral- I think the best treatment option is  intermittent dosing of diazepam.  He also has some suspicious neurological symptoms (ataxia) so I have asked him to undergo an MRI of the brain to see if there is any concern for NPH, demyelination, mass, bleed, or tumor. -     MR Brain Wo Contrast; Future -     diazepam (VALIUM) 2 MG tablet; Take 1 tablet (2 mg total) by mouth every 8 (eight) hours as needed for anxiety.  Ataxia- See above. -     MR Brain Wo Contrast; Future   I am having Derrick Benitez start on diazepam. I am also having him maintain his hydrOXYzine and pioglitazone.  Meds ordered this encounter  Medications   diazepam (VALIUM) 2 MG tablet    Sig: Take 1 tablet (2 mg total) by mouth every 8 (eight) hours as needed for anxiety.    Dispense:  45 tablet    Refill:  1     Follow-up: Return in about 3 weeks (around 04/01/2020).  Sanda Linger, MD

## 2020-03-11 NOTE — Patient Instructions (Signed)
Labyrinthitis  Labyrinthitis is an infection of the inner ear. Your inner ear is made up of tubes and canals (labyrinth). These are filled with fluid. There are nerve cells in your inner ear that send hearing and balance signals to your brain. When germs get inside the tubes and canals, they harm the nerve cells that send signals to the brain. This condition is often caused by a virus. You may have:  Dizziness.  Ringing in the ears.  Loss of hearing.  Loss of balance.  Stomach upset.  Throwing up. Follow these instructions at home: Medicines   Take over-the-counter and prescription medicines only as told by your doctor.  If you were given an antibiotic medicine, take it as told by your doctor. Do not stop taking the antibiotic even if you start to feel better. Activity  Rest as told by your doctor.  Limit the things you do as told. Ask your doctor what is safe for you to do.  Do not make any sudden movements until you no longer feel dizzy.  Do physical therapy as told by your doctor. General instructions  Avoid loud noises and bright lights.  Do not drive until your doctor says that it is safe for you.  Drink enough fluid to keep your pee (urine) pale yellow.  Keep all follow-up visits as told by your doctor. This is important. Contact a doctor if:  Your symptoms do not get better.  You do not get better after two weeks.  You have a fever. Get help right away if:  You are very dizzy.  You keep throwing up.  You keep feeling sick to your stomach.  Your hearing gets much worse all of a sudden. Summary  Labyrinthitis is an infection of the inner ear.  This condition is often caused by a virus. Symptoms include dizziness, hearing loss, and ringing in the ears.  Treatment depends on the cause. Follow what your doctor tells you. This information is not intended to replace advice given to you by your health care provider. Make sure you discuss any questions  you have with your health care provider. Document Revised: 01/15/2018 Document Reviewed: 04/08/2017 Elsevier Patient Education  2020 [employer]  

## 2020-03-26 ENCOUNTER — Encounter: Payer: Self-pay | Admitting: Internal Medicine

## 2020-04-13 ENCOUNTER — Ambulatory Visit: Payer: Medicare Other | Admitting: Internal Medicine

## 2020-04-21 ENCOUNTER — Ambulatory Visit (INDEPENDENT_AMBULATORY_CARE_PROVIDER_SITE_OTHER): Payer: Medicare Other | Admitting: Internal Medicine

## 2020-04-21 ENCOUNTER — Other Ambulatory Visit: Payer: Self-pay

## 2020-04-21 ENCOUNTER — Encounter: Payer: Self-pay | Admitting: Internal Medicine

## 2020-04-21 VITALS — BP 126/82 | HR 77 | Temp 98.0°F | Resp 16 | Ht 70.0 in | Wt 219.0 lb

## 2020-04-21 DIAGNOSIS — R197 Diarrhea, unspecified: Secondary | ICD-10-CM | POA: Insufficient documentation

## 2020-04-21 DIAGNOSIS — A510 Primary genital syphilis: Secondary | ICD-10-CM

## 2020-04-21 DIAGNOSIS — K580 Irritable bowel syndrome with diarrhea: Secondary | ICD-10-CM

## 2020-04-21 DIAGNOSIS — K7581 Nonalcoholic steatohepatitis (NASH): Secondary | ICD-10-CM

## 2020-04-21 LAB — HEPATIC FUNCTION PANEL
ALT: 45 U/L (ref 0–53)
AST: 32 U/L (ref 0–37)
Albumin: 4.7 g/dL (ref 3.5–5.2)
Alkaline Phosphatase: 59 U/L (ref 39–117)
Bilirubin, Direct: 0.2 mg/dL (ref 0.0–0.3)
Total Bilirubin: 0.9 mg/dL (ref 0.2–1.2)
Total Protein: 8.3 g/dL (ref 6.0–8.3)

## 2020-04-21 MED ORDER — VIBERZI 75 MG PO TABS: 1.0000 | ORAL_TABLET | Freq: Two times a day (BID) | ORAL | 0 refills | Status: DC | PRN

## 2020-04-21 NOTE — Progress Notes (Signed)
Subjective:  Patient ID: Derrick Benitez, male    DOB: 1981/04/11  Age: 40 y.o. MRN: [id number]  CC: Follow-up  This visit occurred during the SARS-CoV-2 public health emergency.  Safety protocols were in place, including screening questions prior to the visit, additional usage of staff PPE, and extensive cleaning of exam room while observing appropriate contact time as indicated for disinfecting solutions.    HPI Derrick Benitez presents for f/up - He continues to complain of intermittent abdominal cramps and diarrhea.  He tells me he has previously been evaluated at [facility] motility center and has been diagnosed with IBS/D.  He is no longer taking dicyclomine because he thought it caused weight gain.  Outpatient Medications Prior to Visit  Medication Sig Dispense Refill  . diazepam (VALIUM) 2 MG tablet Take 1 tablet (2 mg total) by mouth every 8 (eight) hours as needed for anxiety. 45 tablet 1  . hydrOXYzine (ATARAX/VISTARIL) 25 MG tablet Take 1 tablet (25 mg total) by mouth every 8 (eight) hours as needed. 30 tablet 0  . pioglitazone (ACTOS) 15 MG tablet Take 1 tablet (15 mg total) by mouth daily. 90 tablet 1  . dicyclomine (BENTYL) 20 MG tablet Take 20 mg by mouth every 6 (six) hours.     No facility-administered medications prior to visit.    ROS Review of Systems  Constitutional: Negative.  Negative for diaphoresis, fatigue and unexpected weight change.  HENT: Negative.  Negative for sore throat and trouble swallowing.   Eyes: Negative for visual disturbance.  Respiratory: Negative for cough, chest tightness, shortness of breath and wheezing.   Cardiovascular: Negative for chest pain, palpitations and leg swelling.  Gastrointestinal: Negative for abdominal pain, diarrhea, nausea and vomiting.  Endocrine: Negative.   Genitourinary: Negative.  Negative for difficulty urinating, dysuria and genital sores.  Musculoskeletal: Negative for arthralgias and myalgias.  Skin: Negative for rash.   Neurological: Negative.  Negative for dizziness, weakness and light-headedness.  Hematological: Negative for adenopathy. Does not bruise/bleed easily.  Psychiatric/Behavioral: Negative.     Objective:  BP 126/82   Pulse 77   Temp 98 F (36.7 C) (Oral)   Resp 16   Ht 5\' 10"  (1.778 m)   Wt 219 lb (99.3 kg)   SpO2 96%   BMI 31.42 kg/m   BP Readings from Last 3 Encounters:  04/21/20 126/82  03/11/20 126/78  01/20/20 122/82    Wt Readings from Last 3 Encounters:  04/21/20 219 lb (99.3 kg)  03/11/20 220 lb (99.8 kg)  01/20/20 214 lb (97.1 kg)    Physical Exam Vitals reviewed.  Constitutional:      Appearance: Normal appearance.  HENT:     Nose: Nose normal.     Mouth/Throat:     Mouth: Mucous membranes are moist.  Eyes:     General: No scleral icterus.    Conjunctiva/sclera: Conjunctivae normal.  Cardiovascular:     Rate and Rhythm: Normal rate and regular rhythm.     Heart sounds: No murmur heard.   Pulmonary:     Effort: Pulmonary effort is normal.     Breath sounds: No stridor. No wheezing, rhonchi or rales.  Abdominal:     General: Abdomen is protuberant. Bowel sounds are normal. There is no distension.     Palpations: Abdomen is soft. There is no hepatomegaly, splenomegaly or mass.     Tenderness: There is no abdominal tenderness.  Musculoskeletal:     Cervical back: Neck supple.  Lymphadenopathy:  Cervical: No cervical adenopathy.  Skin:    General: Skin is warm and dry.     Findings: No rash.  Neurological:     General: No focal deficit present.     Mental Status: He is alert and oriented to person, place, and time. Mental status is at baseline.  Psychiatric:        Mood and Affect: Mood normal.        Behavior: Behavior normal.     Lab Results  Component Value Date   WBC 7.2 01/16/2020   HGB 15.2 01/16/2020   HCT 44.5 01/16/2020   PLT 298.0 01/16/2020   GLUCOSE 85 01/16/2020   ALT 45 04/21/2020   AST 32 04/21/2020   NA 139 01/16/2020    K 4.1 01/16/2020   CL 102 01/16/2020   CREATININE 1.05 01/16/2020   BUN 20 01/16/2020   CO2 24 01/16/2020    US Abdomen Limited RUQ  Result Date: 01/30/2020 CLINICAL DATA:  40 year old male with right upper quadrant abdominal pain. EXAM: ULTRASOUND ABDOMEN LIMITED RIGHT UPPER QUADRANT COMPARISON:  Right upper quadrant ultrasound dated 11/01/2012. FINDINGS: Gallbladder: No gallstones or wall thickening visualized. No sonographic Murphy sign noted by sonographer. Common bile duct: Diameter: 3 mm Liver: There is diffuse increased liver echogenicity most commonly seen in the setting of fatty infiltration. Superimposed inflammation or fibrosis is not excluded. Clinical correlation is recommended. Portal vein is patent on color Doppler imaging with normal direction of blood flow towards the liver. Other: None. IMPRESSION: Fatty liver, otherwise unremarkable right upper quadrant ultrasound. Electronically Signed   By: Elgie Collard M.D.   On: 01/30/2020 22:15    Assessment & Plan:   Saagar was seen today for follow-up.  Diagnoses and all orders for this visit:  NASH (nonalcoholic steatohepatitis)- Will continue pioglitazone. -     Hepatic function panel; Future -     Hepatic function panel  Primary genital syphilis- Over the last 3 months his RPR has decreased from 1:16 to 1:2.  This is a reassuring sign that the syphilis has been adequately treated and there is no evidence of reinfection. -     RPR; Future -     RPR  Irritable bowel syndrome with diarrhea- I have asked him to try eluxadoline. -     Eluxadoline (VIBERZI) 75 MG TABS; Take 1 tablet by mouth 2 (two) times daily as needed.  Diarrhea, unspecified type- Testing for celiac disease is negative. -     Gliadin antibodies, serum; Future -     Tissue transglutaminase, IgA; Future -     Reticulin Antibody, IgA w reflex titer; Future -     Gliadin antibodies, serum -     Tissue transglutaminase, IgA -     Reticulin Antibody, IgA  w reflex titer  Other orders -     Rpr titer -     Fluorescent treponemal ab(fta)-IgG-bld   I have discontinued Antwine Canterbury's dicyclomine. I am also having him start on Viberzi. Additionally, I am having him maintain his hydrOXYzine, pioglitazone, and diazepam.  Meds ordered this encounter  Medications  . Eluxadoline (VIBERZI) 75 MG TABS    Sig: Take 1 tablet by mouth 2 (two) times daily as needed.    Dispense:  96 tablet    Refill:  0     Follow-up: Return in about 3 months (around 07/20/2020).  Sanda Linger, MD

## 2020-04-21 NOTE — Patient Instructions (Signed)

## 2020-04-24 LAB — RPR: RPR Ser Ql: REACTIVE — AB

## 2020-04-24 LAB — GLIADIN ANTIBODIES, SERUM
Gliadin IgA: <1 U/mL
Gliadin IgG: <1 U/mL

## 2020-04-24 LAB — RPR TITER: RPR Titer: 1:2 {titer} — ABNORMAL HIGH

## 2020-04-24 LAB — TISSUE TRANSGLUTAMINASE, IGA: (tTG) Ab, IgA: <1 U/mL

## 2020-04-24 LAB — RETICULIN ANTIBODIES, IGA W TITER: Reticulin IgA Screen: NEGATIVE

## 2020-04-24 LAB — FLUORESCENT TREPONEMAL AB(FTA)-IGG-BLD: Fluorescent Treponemal ABS: REACTIVE — AB

## 2020-04-29 ENCOUNTER — Ambulatory Visit
Admission: RE | Admit: 2020-04-29 | Discharge: 2020-04-29 | Disposition: A | Discharge status: Home / Self Care | Payer: Medicare Other | Source: Ambulatory Visit | Attending: Internal Medicine | Admitting: Internal Medicine

## 2020-04-29 ENCOUNTER — Other Ambulatory Visit: Payer: Self-pay

## 2020-04-29 ENCOUNTER — Other Ambulatory Visit: Payer: Self-pay | Admitting: Internal Medicine

## 2020-04-29 DIAGNOSIS — J3489 Other specified disorders of nose and nasal sinuses: Secondary | ICD-10-CM | POA: Diagnosis not present

## 2020-04-29 DIAGNOSIS — R270 Ataxia, unspecified: Secondary | ICD-10-CM

## 2020-04-29 DIAGNOSIS — H81313 Aural vertigo, bilateral: Secondary | ICD-10-CM

## 2020-04-29 DIAGNOSIS — S0990XA Unspecified injury of head, initial encounter: Secondary | ICD-10-CM | POA: Diagnosis not present

## 2020-04-29 DIAGNOSIS — R9089 Other abnormal findings on diagnostic imaging of central nervous system: Secondary | ICD-10-CM | POA: Insufficient documentation

## 2020-05-26 ENCOUNTER — Telehealth: Payer: Self-pay | Admitting: Neurology

## 2020-05-26 ENCOUNTER — Other Ambulatory Visit: Payer: Self-pay

## 2020-05-26 ENCOUNTER — Encounter: Payer: Self-pay | Admitting: Neurology

## 2020-05-26 ENCOUNTER — Ambulatory Visit (INDEPENDENT_AMBULATORY_CARE_PROVIDER_SITE_OTHER): Payer: Medicare Other | Admitting: Neurology

## 2020-05-26 VITALS — BP 125/78 | HR 69 | Ht 70.0 in | Wt 224.5 lb

## 2020-05-26 DIAGNOSIS — R208 Other disturbances of skin sensation: Secondary | ICD-10-CM

## 2020-05-26 DIAGNOSIS — M542 Cervicalgia: Secondary | ICD-10-CM | POA: Diagnosis not present

## 2020-05-26 DIAGNOSIS — R42 Dizziness and giddiness: Secondary | ICD-10-CM | POA: Diagnosis not present

## 2020-05-26 DIAGNOSIS — R9082 White matter disease, unspecified: Secondary | ICD-10-CM | POA: Diagnosis not present

## 2020-05-26 DIAGNOSIS — H9193 Unspecified hearing loss, bilateral: Secondary | ICD-10-CM | POA: Diagnosis not present

## 2020-05-26 MED ORDER — TRIAMTERENE-HCTZ 37.5-25 MG PO CAPS: 1.0000 | ORAL_CAPSULE | Freq: Every day | ORAL | 5 refills | Status: DC

## 2020-05-26 NOTE — Progress Notes (Signed)
[facility]  PATIENT: Derrick Benitez DOB: 06-08-1980  REFERRING DOCTOR OR PCP: Sanda Linger, MD SOURCE: Patient through interpreter, primary care notes, imaging reports, MRI images personally reviewed.  _________________________________   HISTORICAL  CHIEF COMPLAINT:  Chief Complaint  Patient presents with  . New Patient (Initial Visit)    RM 13. Internal referral for abnormal MRI brain/possible MS. Here with sign language interpreter.    HISTORY OF PRESENT ILLNESS:  I had the pleasure of seeing patient, Derrick Benitez, at [facility] for neurologic consultation regarding his abnormal brain MRI.  Derrick Benitez is a 40 year old deaf man who was noting vertigo and occipital dysesthesias.  The tingling dysesthesia started April 2022.  He gets several spells a week that last hours or up to a day.   He is better after he falls asleep and wakes up. The vertigo is more likely to occur if he has been working longer and gets better with sleep.   He gets nausea and often vomiting when the vertigo occurs.    The first episode of vertigo occurred in September and was associated with a rotational sensation.  Other episodes of vertigo can be either rotational or translational.   He usually gets the tingling when the vertigo occurs.    With the tingling he gets a thumping sensation in the right ear.     He became deaf after a high fever at age 61 months.  He was thought to have [place] Measles or meningitis.   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   He uses sign language.    He denies a history of birth trauma.  He has no history of head injury.  He denies difficulty with vision.  There is no diplopia.  His gait is sometimes mildly off balance but has been so for many years.  He denies any numbness or weakness besides the symptoms above.   Images reviewed:  MRI of the brain from 04/29/2020 showed multiple T2/FLAIR hyperintense foci in the hemispheres.  Most of  these are in the deep white matter though a few are in the periventricular white matter.  Some are subcortical.  1 is juxtacortical.  None of the foci appear to be acute on diffusion-weighted imaging.  The study was done without contrast.  MRI of the lumbar spine 12/19/2017 showed mild facet hypertrophy at L4-L5 and L5-S1 but no significant foraminal narrowing, lateral recess stenosis.  There is no nerve root compression or spinal stenosis.  REVIEW OF SYSTEMS: Constitutional: No fevers, chills, sweats, or change in appetite Eyes: No visual changes, double vision, eye pain Ear, nose and throat: He is deaf.  Episodes of vertigo as above Cardiovascular: No chest pain, palpitations Respiratory: No shortness of breath at rest or with exertion.   No wheezes GastrointestinaI: No nausea, vomiting, diarrhea, abdominal pain, fecal incontinence Genitourinary: No dysuria, urinary retention or frequency.  No nocturia. Musculoskeletal: No neck pain, back pain Integumentary: No rash, pruritus, skin lesions Neurological: as above Psychiatric: No depression at this time.  No anxiety Endocrine: No palpitations, diaphoresis, change in appetite, change in weigh or increased thirst Hematologic/Lymphatic: No anemia, purpura, petechiae. Allergic/Immunologic: No itchy/runny eyes, nasal congestion, recent allergic reactions, rashes  ALLERGIES: Allergies  Allergen Reactions  . Valtrex [Valacyclovir Hcl] Rash    HOME MEDICATIONS:  Current Outpatient Medications:  .  diazepam (VALIUM) 2 MG tablet, Take 1 tablet (2 mg total) by mouth every 8 (eight) hours as needed for anxiety., Disp: 45  tablet, Rfl: 1 .  Eluxadoline (VIBERZI) 75 MG TABS, Take 1 tablet by mouth 2 (two) times daily as needed., Disp: 96 tablet, Rfl: 0 .  hydrOXYzine (ATARAX/VISTARIL) 25 MG tablet, Take 1 tablet (25 mg total) by mouth every 8 (eight) hours as needed., Disp: 30 tablet, Rfl: 0 .  pioglitazone (ACTOS) 15 MG tablet, Take 1 tablet (15  mg total) by mouth daily., Disp: 90 tablet, Rfl: 1  PAST MEDICAL HISTORY: Past Medical History:  Diagnosis Date  . Deaf   . Irritable bowel syndrome (IBS)     PAST SURGICAL HISTORY: History reviewed. No pertinent surgical history.  FAMILY HISTORY: Family History  Problem Relation Age of Onset  . Stomach cancer Neg Hx   . Colon cancer Neg Hx     SOCIAL HISTORY:  Social History   Socioeconomic History  . Marital status: Single    Spouse name: Not on file  . Number of children: 0  . Years of education: 6112  . Highest education level: Not on file  Occupational History  . Occupation: [employer]  Tobacco Use  . Smoking status: Never Smoker  . Smokeless tobacco: Never Used  Vaping Use  . Vaping Use: Never used  Substance and Sexual Activity  . Alcohol use: Yes    Alcohol/week: 3.0 standard drinks    Types: 3 Cans of beer per week    Comment: occ   . Drug use: Not Currently    Types: Marijuana    Comment: Occ use  . Sexual activity: Yes  Other Topics Concern  . Not on file  Social History Narrative   Right handed   Caffeine use: Coke sometimes   Lives with roommate   Social Determinants of Health   Financial Resource Strain: Not on file  Food Insecurity: Not on file  Transportation Needs: Not on file  Physical Activity: Not on file  Stress: Not on file  Social Connections: Not on file  Intimate Partner Violence: Not on file     PHYSICAL EXAM  Vitals:   05/26/20 1318  BP: 125/78  Pulse: 69  SpO2: 97%  Weight: 224 lb 8 oz (101.8 kg)  Height: 5\' 10"  (1.778 m)    Body mass index is 32.21 kg/m.   General: The patient is well-developed and well-nourished and in no acute distress  HEENT:  Head is [place]/AT.  Sclera are anicteric.  Funduscopic exam shows normal optic discs and retinal vessels.  Neck: No carotid bruits are noted.  The neck is tender on the right occiput.  Cardiovascular: The heart has a regular rate and rhythm with a normal S1 and S2. There  were no murmurs, gallops or rubs.    Skin: Extremities are without rash or edema.  Musculoskeletal:  Back is nontender  Neurologic Exam  Mental status: The patient is alert and oriented x 3 at the time of the examination. The patient has apparent normal recent and remote memory, with an apparently normal attention span and concentration ability.   Speech is normal.  Cranial nerves: Extraocular movements are full. Pupils are equal, round, and reactive to light and accomodation.  Visual fields are full.  Facial symmetry is present. There is good facial sensation to soft touch bilaterally.Facial strength is normal.  Trapezius and sternocleidomastoid strength is normal. No dysarthria is noted.  The tongue is midline, and the patient has symmetric elevation of the soft palate. He is deaf on the left and reduced on right.  Weber lateralizes to the left  Motor:  Muscle bulk is normal.   Tone is normal. Strength is  5 / 5 in all 4 extremities.   Sensory: Sensory testing showed reduced vibration sensation in right arm and leg relative to the left.  Coordination: Cerebellar testing reveals good finger-nose-finger and heel-to-shin bilaterally.  Gait and station: Station is normal.   Gait is normal. Tandem gait is mildly wide. Romberg is negative.   Reflexes: Deep tendon reflexes are symmetric and normal in arms, 3 at knees and 2 at ankles.         DIAGNOSTIC DATA (LABS, IMAGING, TESTING) - I reviewed patient records, labs, notes, testing and imaging myself where available.  Lab Results  Component Value Date   WBC 7.2 01/16/2020   HGB 15.2 01/16/2020   HCT 44.5 01/16/2020   MCV 85.8 01/16/2020   PLT 298.0 01/16/2020      Component Value Date/Time   NA 139 01/16/2020 1723   K 4.1 01/16/2020 1723   CL 102 01/16/2020 1723   CO2 24 01/16/2020 1723   GLUCOSE 85 01/16/2020 1723   BUN 20 01/16/2020 1723   CREATININE 1.05 01/16/2020 1723   CALCIUM 8.7 01/16/2020 1723   PROT 8.3 04/21/2020  1543   ALBUMIN 4.7 04/21/2020 1543   AST 32 04/21/2020 1543   ALT 45 04/21/2020 1543   ALKPHOS 59 04/21/2020 1543   BILITOT 0.9 04/21/2020 1543   GFRNONAA 89 01/16/2020 1723   GFRAA 103 01/16/2020 1723       ASSESSMENT AND PLAN  Vertigo - Plan: Sedimentation rate, Pan-ANCA, ANA w/Reflex, Vitamin B12  Dysesthesia - Plan: Sedimentation rate, Pan-ANCA, ANA w/Reflex, Vitamin B12, MR CERVICAL SPINE WO CONTRAST  White matter abnormality on MRI of brain - Plan: Sedimentation rate, Pan-ANCA, ANA w/Reflex, Vitamin B12, MR CERVICAL SPINE WO CONTRAST  Bilateral deafness  Neck pain - Plan: MR CERVICAL SPINE WO CONTRAST   In summary, Derrick Benitez is a 40 year old man with deafness since age 89 months who has had episodes of vertigo and neck/posterior scalp dysesthesias.  Due to those symptoms, he had an MRI of the brain which was abnormal showing white matter foci.  He was most concerned about MS.  His clinical symptoms are not typical for MS.  Additionally, the changes on the MRI or nonspecific..  The etiology could be chronic microvascular ischemic change, sequela of previous infection or trauma or prenatal injury, or demyelination.  The spells of vertigo lasts at least several hours and are associated with nausea and vomiting, Mnire's disease is another possibility.  I will have him try Dyazide.  If he does not note any benefit in 3 to 4 weeks he should stop.  We will check some lab work for the symptoms and MRI abnormalities.  Because of his age, MS is still a possibility despite the nonspecific appearance.  On his exam, he also had some numbness on the left side  He will return to see [place] in 3 months for regular visit or earlier based on the right side.  I will check an MRI of the cervical spine.  If he does have foci within the spinal cord, the likelihood of MS is much higher and we will begin a disease modifying therapy.  If the cervical spine MRI does not show any foci and his spells persist we  will check cerebrospinal fluid to determine if there are oligoclonal bands.  Thank you for asking me to see Derrick Benitez.  Please let me know if I can be of  further assistance with him or other patients in the future.Pearletha Furl. Epimenio Foot, MD, [facility] 05/26/2020, 2:10 PM Certified in Neurology, Clinical Neurophysiology, Sleep Medicine and Neuroimaging  [facility] [address], [address] [place], [place] [address] [phone]

## 2020-05-26 NOTE — Telephone Encounter (Signed)
[email or web address] for interpreters has called to inform they have no one avail to come to appointment for pt.  Shanda Bumps also states they have not (nor are they required) informed pt.  Shanda Bumps suggest pt be called and try and work in for later today then call her to see if they have someone available or r/s pt for another day. Shanda Bumps can be reached at [phone]

## 2020-05-26 NOTE — Telephone Encounter (Signed)
Notified Dr. Epimenio Foot. Michelle at check in states interpreter can be here at 1:45pm. Dr. Epimenio Foot agreed to see pt then when interpreter arrives.

## 2020-05-26 NOTE — Telephone Encounter (Signed)
Medicare/medicaid order sent to [facility]. No auth they will reach out to the patient to schedule.  °

## 2020-05-28 LAB — PAN-ANCA
ANCA Proteinase 3: <3.5 U/mL (ref 0.0–3.5)
Atypical pANCA: <1:20 {titer}
C-ANCA: <1:20 {titer}
Myeloperoxidase Ab: <9 U/mL (ref 0.0–9.0)
P-ANCA: <1:20 {titer}

## 2020-05-28 LAB — VITAMIN B12: Vitamin B-12: 492 pg/mL (ref 232–1245)

## 2020-05-28 LAB — SEDIMENTATION RATE: Sed Rate: 9 mm/hr (ref 0–15)

## 2020-05-28 LAB — ANA W/REFLEX: Anti Nuclear Antibody (ANA): NEGATIVE

## 2020-06-03 ENCOUNTER — Telehealth: Payer: Self-pay | Admitting: Internal Medicine

## 2020-06-03 ENCOUNTER — Encounter: Payer: Self-pay | Admitting: Internal Medicine

## 2020-06-03 ENCOUNTER — Other Ambulatory Visit: Payer: Self-pay | Admitting: Internal Medicine

## 2020-06-03 DIAGNOSIS — K580 Irritable bowel syndrome with diarrhea: Secondary | ICD-10-CM

## 2020-06-03 MED ORDER — VIBERZI 75 MG PO TABS: 1.0000 | ORAL_TABLET | Freq: Two times a day (BID) | ORAL | 1 refills | Status: DC | PRN

## 2020-06-03 NOTE — Telephone Encounter (Signed)
Patient called and said that Eluxadoline (VIBERZI) 75 MG TABS has been helping and that he is out of samples and was wondering if a prescription could be called in to [id number] - [place], [place] - [address] AT [facility] [address]. Please advise

## 2020-06-03 NOTE — Telephone Encounter (Signed)
error 

## 2020-06-03 NOTE — Telephone Encounter (Signed)
Patient has called back asking if there is an alternative medication he could take that is cheaper

## 2020-06-05 ENCOUNTER — Telehealth: Payer: Self-pay

## 2020-06-05 NOTE — Telephone Encounter (Signed)
Key: [id number]

## 2020-06-10 ENCOUNTER — Other Ambulatory Visit: Payer: Self-pay

## 2020-06-10 ENCOUNTER — Ambulatory Visit
Admission: RE | Admit: 2020-06-10 | Discharge: 2020-06-10 | Disposition: A | Discharge status: Home / Self Care | Payer: Medicare Other | Source: Ambulatory Visit | Attending: Neurology | Admitting: Neurology

## 2020-06-10 DIAGNOSIS — M542 Cervicalgia: Secondary | ICD-10-CM

## 2020-06-10 DIAGNOSIS — R208 Other disturbances of skin sensation: Secondary | ICD-10-CM | POA: Diagnosis not present

## 2020-06-10 DIAGNOSIS — R9082 White matter disease, unspecified: Secondary | ICD-10-CM | POA: Diagnosis not present

## 2020-06-12 ENCOUNTER — Other Ambulatory Visit: Payer: Medicare Other

## 2020-06-12 NOTE — Telephone Encounter (Signed)
PA was denied.  Waiting on appeal determination.

## 2020-06-12 NOTE — Telephone Encounter (Signed)
Patient would like status of prior auth

## 2020-06-17 ENCOUNTER — Other Ambulatory Visit: Payer: Self-pay | Admitting: Internal Medicine

## 2020-06-17 DIAGNOSIS — K580 Irritable bowel syndrome with diarrhea: Secondary | ICD-10-CM

## 2020-06-17 MED ORDER — VIBERZI 75 MG PO TABS: 1.0000 | ORAL_TABLET | Freq: Two times a day (BID) | ORAL | 0 refills | Status: DC | PRN

## 2020-06-17 NOTE — Telephone Encounter (Signed)
Appeal was approved  06/06/20-04/10/38  Determination sent to scan

## 2020-06-17 NOTE — Telephone Encounter (Signed)
Patient is requesting that Eluxadoline (VIBERZI) 75 MG TABS be sent to [facility] [address] [place], [place] [address]. He states he is visiting family and this would be a one time thing. Please advise

## 2020-07-21 ENCOUNTER — Ambulatory Visit (INDEPENDENT_AMBULATORY_CARE_PROVIDER_SITE_OTHER): Payer: Medicare Other | Admitting: Internal Medicine

## 2020-07-21 ENCOUNTER — Other Ambulatory Visit: Payer: Self-pay

## 2020-07-21 ENCOUNTER — Encounter: Payer: Self-pay | Admitting: Internal Medicine

## 2020-07-21 VITALS — BP 128/84 | HR 84 | Temp 98.3°F | Resp 16 | Ht 70.0 in | Wt 214.0 lb

## 2020-07-21 DIAGNOSIS — A510 Primary genital syphilis: Secondary | ICD-10-CM | POA: Diagnosis not present

## 2020-07-21 DIAGNOSIS — Z7252 High risk homosexual behavior: Secondary | ICD-10-CM | POA: Diagnosis not present

## 2020-07-21 DIAGNOSIS — I10 Essential (primary) hypertension: Secondary | ICD-10-CM | POA: Diagnosis not present

## 2020-07-21 DIAGNOSIS — Z114 Encounter for screening for human immunodeficiency virus [HIV]: Secondary | ICD-10-CM

## 2020-07-21 LAB — BASIC METABOLIC PANEL
BUN: 26 mg/dL — ABNORMAL HIGH (ref 6–23)
CO2: 30 mEq/L (ref 19–32)
Calcium: 9.6 mg/dL (ref 8.4–10.5)
Chloride: 100 mEq/L (ref 96–112)
Creatinine, Ser: 1.1 mg/dL (ref 0.40–1.50)
GFR: 84.27 mL/min (ref >60.00)
Glucose, Bld: 136 mg/dL — ABNORMAL HIGH (ref 70–99)
Potassium: 3.5 mEq/L (ref 3.5–5.1)
Sodium: 139 mEq/L (ref 135–145)

## 2020-07-21 NOTE — Progress Notes (Signed)
Subjective:  Patient ID: Derrick Benitez, male    DOB: [birth year]  Age: 40 y.o. MRN: [id number]  CC: Follow-up (3 month f/u)  This visit occurred during the SARS-CoV-2 public health emergency.  Safety protocols were in place, including screening questions prior to the visit, additional usage of staff PPE, and extensive cleaning of exam room while observing appropriate contact time as indicated for disinfecting solutions.   History is obtained using a sign language interpreter.  HPI Derrick Benitez presents for f/up -  Since I last saw him he has seen a neurologist and is being treated for dizzy spells with a combination of triamterene and hydrochlorothiazide.  He says there has been some improvement.  He continues to be sexually active.  He tells me that he uses condoms.  He would like to start taking PrEP.  He denies any recent rash, lymphadenopathy, fever, chills, night sweats, penile lesions, or dysuria.  Outpatient Medications Prior to Visit  Medication Sig Dispense Refill  . diazepam (VALIUM) 2 MG tablet Take 1 tablet (2 mg total) by mouth every 8 (eight) hours as needed for anxiety. 45 tablet 1  . Eluxadoline (VIBERZI) 75 MG TABS Take 1 tablet by mouth 2 (two) times daily as needed. 180 tablet 0  . hydrOXYzine (ATARAX/VISTARIL) 25 MG tablet Take 1 tablet (25 mg total) by mouth every 8 (eight) hours as needed. 30 tablet 0  . pioglitazone (ACTOS) 15 MG tablet Take 1 tablet (15 mg total) by mouth daily. 90 tablet 1  . triamterene-hydrochlorothiazide (DYAZIDE) 37.5-25 MG capsule Take 1 each (1 capsule total) by mouth daily. 30 capsule 5   No facility-administered medications prior to visit.    ROS Review of Systems  Constitutional: Negative.  Negative for chills, fatigue and fever.  HENT: Negative.  Negative for sore throat and trouble swallowing.   Eyes: Negative.   Respiratory: Negative for cough, chest tightness, shortness of breath and wheezing.   Cardiovascular: Negative for chest  pain, palpitations and leg swelling.  Gastrointestinal: Negative for abdominal pain, diarrhea, nausea and vomiting.  Endocrine: Negative.   Genitourinary: Negative for difficulty urinating, dysuria, genital sores, hematuria, scrotal swelling and testicular pain.  Musculoskeletal: Negative for arthralgias and myalgias.  Skin: Negative.  Negative for color change and rash.  Neurological: Positive for dizziness. Negative for weakness and light-headedness.  Hematological: Negative for adenopathy. Does not bruise/bleed easily.  Psychiatric/Behavioral: Negative.     Objective:  BP 128/84 (BP Location: Right Arm, Patient Position: Sitting, Cuff Size: Large)   Pulse 84   Temp 98.3 F (36.8 C) (Oral)   Resp 16   Ht 5\' 10"  (1.778 m)   Wt 214 lb (97.1 kg)   SpO2 97%   BMI 30.71 kg/m   BP Readings from Last 3 Encounters:  07/21/20 128/84  05/26/20 125/78  04/21/20 126/82    Wt Readings from Last 3 Encounters:  07/21/20 214 lb (97.1 kg)  05/26/20 224 lb 8 oz (101.8 kg)  04/21/20 219 lb (99.3 kg)    Physical Exam Vitals reviewed.  HENT:     Nose: Nose normal.     Mouth/Throat:     Mouth: Mucous membranes are moist.  Eyes:     General: No scleral icterus.    Conjunctiva/sclera: Conjunctivae normal.  Cardiovascular:     Rate and Rhythm: Normal rate and regular rhythm.     Heart sounds: No murmur heard.   Pulmonary:     Effort: Pulmonary effort is normal.     Breath  sounds: No stridor. No wheezing, rhonchi or rales.  Abdominal:     General: Abdomen is protuberant. Bowel sounds are normal. There is no distension.     Palpations: Abdomen is soft. There is no hepatomegaly, splenomegaly or mass.  Musculoskeletal:        General: Normal range of motion.     Cervical back: Neck supple.     Right lower leg: No edema.     Left lower leg: No edema.  Lymphadenopathy:     Cervical: No cervical adenopathy.  Skin:    General: Skin is warm and dry.     Findings: No rash.   Neurological:     General: No focal deficit present.     Mental Status: He is alert.  Psychiatric:        Mood and Affect: Mood normal.        Behavior: Behavior normal.     Lab Results  Component Value Date   WBC 7.2 01/16/2020   HGB 15.2 01/16/2020   HCT 44.5 01/16/2020   PLT 298.0 01/16/2020   GLUCOSE 136 (H) 07/21/2020   ALT 45 04/21/2020   AST 32 04/21/2020   NA 139 07/21/2020   K 3.5 07/21/2020   CL 100 07/21/2020   CREATININE 1.10 07/21/2020   BUN 26 (H) 07/21/2020   CO2 30 07/21/2020    MR CERVICAL SPINE WO CONTRAST  Result Date: 06/10/2020  [facility] [address] [place], [place] [address] [phone] NEUROIMAGING REPORT STUDY DATE: 06/10/2020 PATIENT NAME: Derrick Benitez DOB: 03-Apr-1981 MRN: [id number] EXAM: MRI of the cervical spine ORDERING CLINICIAN: Richard A. Sater, MD. PhD CLINICAL HISTORY: 40 year old man with an abnormal brain MRI, dysesthesias and neck pain COMPARISON FILMS: None TECHNIQUE: MRI of the cervical spine was obtained utilizing 3 mm sagittal slices from the posterior fossa down to the T3-4 level with T1, T2 and inversion recovery views. In addition 4 mm axial slices from C2-3 down to T1-2 level were included with T2 and gradient echo views. CONTRAST: None IMAGING SITE: [facility], [address] [place], [place], [place] FINDINGS: :  On sagittal images, the spine is imaged from above the cervicomedullary junction to T2.   Visible brain appears normal.  Paravertebral soft tissue appears normal.  The spinal cord is of normal caliber and signal.    Minimal reversal of the cervical curvature..  There is no spondylolisthesis.   The vertebral bodies have normal signal.  The discs and interspaces were further evaluated on axial views from C2 to T1 as follows: C2 - C3:  The disc and interspace appear normal. C3 - C4: There is minimal disc bulging.  There is no foraminal narrowing or spinal stenosis.  No nerve root compression.  C4 - C5:   There is minimal disc bulging.  There is no foraminal narrowing or spinal stenosis.  No nerve root compression.  C5 - C6:  The disc and interspace appear normal. C6 - C7:  There is minimal disc bulging.  There is no foraminal narrowing or spinal stenosis.  No nerve root compression.  C7 - T1:  The disc and interspace appear normal.   This MRI of the cervical spine without contrast shows the following: 1.   The spinal cord appears normal.  There is no evidence of demyelination. 2.   Minimal disc degenerative changes at C3-C4 and C4-C5 and C6-C7 that does not cause spinal stenosis or nerve root compression. INTERPRETING PHYSICIAN: Richard A. Epimenio Foot, MD, PhD, FAAN Certified in  Neuroimaging by American Society of Neuroimaging    Assessment & Plan:   Adrean was seen today for follow-up.  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  This is a reassuring sign that he does not have recurrent infection or reinfection. -     RPR; Future -     Basic metabolic panel; Future -     HIV Antibody (routine testing w rflx); Future -     HIV Antibody (routine testing w rflx) -     Basic metabolic panel -     RPR  High risk homosexual behavior- Screening for HIV and hep B are negative.  His renal function is normal.  He will start taking descovy. -     Basic metabolic panel; Future -     HIV Antibody (routine testing w rflx); Future -     Hepatitis B surface antigen; Future -     Hepatitis B surface antigen -     HIV Antibody (routine testing w rflx) -     Basic metabolic panel -     emtricitabine-tenofovir AF (DESCOVY) 200-25 MG tablet; Take 1 tablet by mouth daily.  Encounter for screening for HIV -     HIV Antibody (routine testing w rflx); Future -     Hepatitis B surface antigen; Future -     Hepatitis B surface antigen -     HIV Antibody (routine testing w rflx)  Primary hypertension- His blood pressure is adequately well controlled.  Lites  and renal function are normal. -     Basic metabolic panel; Future -     Basic metabolic panel  Other orders -     Rpr titer -     Fluorescent treponemal ab(fta)-IgG-bld   I am having Derrick Benitez start on Descovy. I am also having him maintain his hydrOXYzine, pioglitazone, diazepam, triamterene-hydrochlorothiazide, and Viberzi.  Meds ordered this encounter  Medications  . emtricitabine-tenofovir AF (DESCOVY) 200-25 MG tablet    Sig: Take 1 tablet by mouth daily.    Dispense:  90 tablet    Refill:  1     Follow-up: Return in about 3 months (around 10/20/2020).  Sanda Linger, MD

## 2020-07-21 NOTE — Patient Instructions (Signed)
Syphilis Syphilis is an infection that can spread through sexual contact. The infection can cause serious complications, so it is important to get treatment right away. There are four stages of syphilis:  Primary stage. During this stage sores may form where the disease entered your body.  Secondary stage. During this stage skin rashes and lesions will form.  Latent stage. During this stage there are no symptoms, but the infection may still be contagious.  Tertiary stage. This stage happens 10-30 years after the infection starts. During this stage, the disease damages organs and can lead to death. Most people do not develop this stage of syphilis. What are the causes? This condition is caused by bacteria called Treponema pallidum. The condition can spread during sexual activity, such as during oral, anal, or vaginal sex. It can also be spread from mother to fetus during pregnancy. What increases the risk? You are more likely to develop this condition if:  You do not use a condom.  You have or had another sexually transmitted infection (STI).  You have multiple sex partners.  You use illegal drugs through an IV. What are the signs or symptoms? Symptoms of this condition depend on the stage of the disease. Primary stage  Painless sores (chancres) in and around the genital organs, mouth, or hands. The sores are usually firm and round. Secondary stage  A rash or sores. The rash usually does not itch.  A fever.  A headache.  A sore throat.  Swollen lymph nodes.  New sores in the mouth or on the genitals.  A feeling of being ill.  Pain in the joints.  Patchy hair loss.  Weight loss.  Fatigue. Latent stage There are no symptoms during this stage. Tertiary stage  Dementia.  Personality and mood changes.  Difficulty walking and coordinating movements.  Muscle weakness or paralysis.  Problems with coordination.  Heart failure.  Trouble  breathing.  Fainting.  Soft, rubbery growths on the skin, bones, or liver (gummas).  Sudden "lightning" pains, numbness, or tingling.  Vision changes.  Hearing changes.  Trouble controlling your urine and bowel movements. How is this diagnosed? This condition is diagnosed with:  A physical exam.  Blood tests.  Tests of the fluid (drainage) from a sore or rash.  Tests of the fluid around the spine (lumbar puncture). These tests are done to check for an infection in the brain or nervous system.  Imaging tests. These may be done to check for damage to the heart, aorta, or brain if the condition is in the tertiary stage. Tests may include: ? An X-ray. ? A CT scan. ? An MRI. ? An echocardiogram. This test takes a picture of the heart. ? An ultrasound.   How is this treated? This condition can be cured with antibiotic medicine. During the first day of treatment, the medicine may cause you to experience fever, chills, headache, nausea, or aching all over your body. This is normal and should go away within 24 hours. Follow these instructions at home: Medicines  Take over-the-counter and prescription medicines only as told by your health care provider.  Take your antibiotic medicine as told by your health care provider. Do not stop taking the antibiotic even if you start to feel better. Incomplete treatment will put you at risk for continued infection and could be life threatening.   General instructions  Do not have sex until your treatment is completed, or as directed by your health care provider.  Tell your recent sexual  partners that you were diagnosed with syphilis. It is important that they get treatment, even if they do not have symptoms.  Keep all follow-up visits as told by your health care provider. This is important. How is this prevented?  Use latex condoms correctly whenever you have sex.  Before you have sex, ask your partner if he or she has been tested for  STIs. Ask about the test results.  Avoid having multiple sexual partners. Contact a health care provider if:  You continue to have any of the following symptoms 24 hours after beginning treatment: ? Fever. ? Chills. ? Headache. ? Nausea. ? Aching all over your body.  Your symptoms do not improve, even with treatment. Get help right away if:  You have severe chest pain.  You have trouble walking or coordinating movements.  You are confused.  You lose vision or hearing.  You have numbness in your arms or legs.  You have a seizure.  You faint.  You have a severe headache that does not go away with medicine. Summary  Syphilis is an infection that can spread through sexual contact.  This condition can cause serious complications, so it is best to get treatment right away. The condition can be cured with antibiotic medicine.  This condition can also be spread from mother to fetus during pregnancy.  Take your antibiotic medicine as told by your health care provider.  Tell your recent sexual partners that you were diagnosed with syphilis. It is important that they get treatment, even if they do not have symptoms. This information is not intended to replace advice given to you by your health care provider. Make sure you discuss any questions you have with your health care provider. Document Revised: 10/09/2018 Document Reviewed: 05/24/2016 Elsevier Patient Education  2021 [employer].

## 2020-07-23 LAB — HEPATITIS B SURFACE ANTIGEN: Hepatitis B Surface Ag: NONREACTIVE

## 2020-07-23 LAB — FLUORESCENT TREPONEMAL AB(FTA)-IGG-BLD: Fluorescent Treponemal ABS: REACTIVE — AB

## 2020-07-23 LAB — HIV ANTIBODY (ROUTINE TESTING W REFLEX): HIV 1&2 Ab, 4th Generation: NONREACTIVE

## 2020-07-23 LAB — RPR: RPR Ser Ql: REACTIVE — AB

## 2020-07-23 LAB — RPR TITER: RPR Titer: 1:1 {titer} — ABNORMAL HIGH

## 2020-07-23 MED ORDER — DESCOVY 200-25 MG PO TABS
1.0000 | ORAL_TABLET | Freq: Every day | ORAL | 1 refills | Status: DC
Start: 2020-07-23 — End: 2021-03-11

## 2020-07-30 ENCOUNTER — Other Ambulatory Visit: Payer: Self-pay | Admitting: Internal Medicine

## 2020-07-30 DIAGNOSIS — K7581 Nonalcoholic steatohepatitis (NASH): Secondary | ICD-10-CM

## 2020-09-14 DIAGNOSIS — J069 Acute upper respiratory infection, unspecified: Secondary | ICD-10-CM | POA: Diagnosis not present

## 2020-09-14 DIAGNOSIS — R112 Nausea with vomiting, unspecified: Secondary | ICD-10-CM | POA: Diagnosis not present

## 2020-09-14 DIAGNOSIS — R070 Pain in throat: Secondary | ICD-10-CM | POA: Diagnosis not present

## 2020-10-08 ENCOUNTER — Encounter: Payer: Self-pay | Admitting: Internal Medicine

## 2020-10-08 ENCOUNTER — Other Ambulatory Visit: Payer: Self-pay

## 2020-10-08 ENCOUNTER — Ambulatory Visit (INDEPENDENT_AMBULATORY_CARE_PROVIDER_SITE_OTHER): Payer: Medicare Other | Admitting: Internal Medicine

## 2020-10-08 VITALS — BP 128/80 | HR 81 | Temp 98.4°F | Resp 18 | Ht 70.0 in | Wt 223.2 lb

## 2020-10-08 DIAGNOSIS — M545 Low back pain, unspecified: Secondary | ICD-10-CM | POA: Diagnosis not present

## 2020-10-08 DIAGNOSIS — I10 Essential (primary) hypertension: Secondary | ICD-10-CM | POA: Diagnosis not present

## 2020-10-08 DIAGNOSIS — M47816 Spondylosis without myelopathy or radiculopathy, lumbar region: Secondary | ICD-10-CM | POA: Diagnosis not present

## 2020-10-08 MED ORDER — CYCLOBENZAPRINE HCL 5 MG PO TABS: 5.0000 mg | ORAL_TABLET | Freq: Three times a day (TID) | ORAL | 1 refills | Status: DC | PRN

## 2020-10-08 MED ORDER — PREDNISONE 10 MG PO TABS: ORAL_TABLET | ORAL | 0 refills | Status: DC

## 2020-10-08 MED ORDER — TRAMADOL HCL 50 MG PO TABS: 50.0000 mg | ORAL_TABLET | Freq: Four times a day (QID) | ORAL | 0 refills | Status: DC | PRN

## 2020-10-08 NOTE — Patient Instructions (Signed)
Please take all new medication as prescribed - the pain medication, muscle relaxer as needed, and prednisone for a short time  Please continue all other medications as before, and refills have been done if requested.  Please have the pharmacy call with any other refills you may need.  Please keep your appointments with your specialists as you may have planned

## 2020-10-08 NOTE — Progress Notes (Signed)
Patient ID: Derrick Benitez, male   DOB: [birth year], 40 y.o.   MRN: [id number]        Chief Complaint: follow up low back pain       HPI:  Derrick Benitez is a 40 y.o. 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  Has had a wt limit of 15 lbs for 1 yr per last provider.  Nothing else makes better or worse.  Pt denies bowel or bladder change, fever, wt loss,  worsening LE pain/numbness/weakness.  Last MRI 2019 c/w mild DJD lowest lumbar.  Pt denies chest pain, increased sob or doe, wheezing, orthopnea, PND, increased LE swelling, palpitations, dizziness or syncope.   Pt denies polydipsia, polyuria, or new focal neuro s/s.   Pt denies fever, wt loss, night sweats, loss of appetite, or other constitutional symptoms.       Wt Readings from Last 3 Encounters:  10/08/20 223 lb 3.2 oz (101.2 kg)  07/21/20 214 lb (97.1 kg)  05/26/20 224 lb 8 oz (101.8 kg)   BP Readings from Last 3 Encounters:  10/08/20 128/80  07/21/20 128/84  05/26/20 125/78         Past Medical History:  Diagnosis Date   Deaf    Irritable bowel syndrome (IBS)    History reviewed. No pertinent surgical history.  reports that he has never smoked. He has never used smokeless tobacco. He reports current alcohol use of about 3.0 standard drinks of alcohol per week. He reports previous drug use. Drug: Marijuana. family history is not on file. Allergies  Allergen Reactions   Valtrex [Valacyclovir Hcl] Rash   Current Outpatient Medications on File Prior to Visit  Medication Sig Dispense Refill   diazepam (VALIUM) 2 MG tablet Take 1 tablet (2 mg total) by mouth every 8 (eight) hours as needed for anxiety. 45 tablet 1   Eluxadoline (VIBERZI) 75 MG TABS Take 1 tablet by mouth 2 (two) times  daily as needed. 180 tablet 0   emtricitabine-tenofovir AF (DESCOVY) 200-25 MG tablet Take 1 tablet by mouth daily. 90 tablet 1   hydrOXYzine (ATARAX/VISTARIL) 25 MG tablet Take 1 tablet (25 mg total) by mouth every 8 (eight) hours as needed. 30 tablet 0   pioglitazone (ACTOS) 15 MG tablet TAKE 1 TABLET(15 MG) BY MOUTH DAILY 90 tablet 1   triamterene-hydrochlorothiazide (DYAZIDE) 37.5-25 MG capsule Take 1 each (1 capsule total) by mouth daily. 30 capsule 5   No current facility-administered medications on file prior to visit.        ROS:  All others reviewed and negative.  Objective        PE:  BP 128/80   Pulse 81   Temp 98.4 F (36.9 C) (Oral)   Resp 18   Ht 5\' 10"  (1.778 m)   Wt 223 lb 3.2 oz (101.2 kg)   SpO2 95%   BMI 32.03 kg/m                 Constitutional: Pt appears in NAD               HENT: Head: NCAT.                Right Ear: External ear normal.  Left Ear: External ear normal.                Eyes: . Pupils are equal, round, and reactive to light. Conjunctivae and EOM are normal               Nose: without d/c or deformity               Neck: Neck supple. Gross normal ROM               Cardiovascular: Normal rate and regular rhythm.                 Pulmonary/Chest: Effort normal and breath sounds without rales or wheezing.                Abd:  Soft, NT, ND, + BS, no organomegaly               Neurological: Pt is alert. At baseline orientation, motor grossly intact, sens intact, gait stable; spine mild tender lowest lumbar bilateral paravertebral without rash or swelling               Skin: Skin is warm. No rashes, no other new lesions, LE edema - none               Psychiatric: Pt behavior is normal without agitation   Micro: none  Cardiac tracings I have personally interpreted today:  none  Pertinent Radiological findings (summarize): sept 10 2019 LS Spine MRI IMPRESSION: 1. Mild bilateral facet hypertrophy at L4-5 and L5-S1. 2. Otherwise  unremarkable MRI of the lumbar spine.     Lab Results  Component Value Date   WBC 7.2 01/16/2020   HGB 15.2 01/16/2020   HCT 44.5 01/16/2020   PLT 298.0 01/16/2020   GLUCOSE 136 (H) 07/21/2020   ALT 45 04/21/2020   AST 32 04/21/2020   NA 139 07/21/2020   K 3.5 07/21/2020   CL 100 07/21/2020   CREATININE 1.10 07/21/2020   BUN 26 (H) 07/21/2020   CO2 30 07/21/2020   Assessment/Plan:  Derrick Benitez is a 40 y.o. White or Caucasian [1] male with  has a past medical history of Deaf and Irritable bowel syndrome (IBS).  Low back pain Etiology unclear but suspect flare of lumbar DJD and possible msk strain, for pain medication, muscle relaxer and predpac asd,  to f/u any worsening symptoms or concerns  DJD (degenerative joint disease), lumbar Ok for advil prn when improved  HTN (hypertension) BP Readings from Last 3 Encounters:  10/08/20 128/80  07/21/20 128/84  05/26/20 125/78   Stable, pt to continue medical treatment dyazide  Followup: Return if symptoms worsen or fail to improve.  Derrick Barre, MD 10/11/2020 9:41 PM [facility]

## 2020-10-11 ENCOUNTER — Encounter: Payer: Self-pay | Admitting: Internal Medicine

## 2020-10-11 DIAGNOSIS — I10 Essential (primary) hypertension: Secondary | ICD-10-CM | POA: Insufficient documentation

## 2020-10-11 NOTE — Assessment & Plan Note (Signed)
Etiology unclear but suspect flare of lumbar DJD and possible msk strain, for pain medication, muscle relaxer and predpac asd,  to f/u any worsening symptoms or concerns

## 2020-10-11 NOTE — Assessment & Plan Note (Signed)
Ok for advil prn when improved

## 2020-10-11 NOTE — Assessment & Plan Note (Signed)
BP Readings from Last 3 Encounters:  10/08/20 128/80  07/21/20 128/84  05/26/20 125/78   Stable, pt to continue medical treatment dyazide

## 2020-11-19 ENCOUNTER — Other Ambulatory Visit: Payer: Self-pay | Admitting: Neurology

## 2020-12-12 DIAGNOSIS — R109 Unspecified abdominal pain: Secondary | ICD-10-CM | POA: Diagnosis not present

## 2020-12-12 DIAGNOSIS — R1031 Right lower quadrant pain: Secondary | ICD-10-CM | POA: Diagnosis not present

## 2020-12-12 DIAGNOSIS — R590 Localized enlarged lymph nodes: Secondary | ICD-10-CM | POA: Diagnosis not present

## 2020-12-12 DIAGNOSIS — Z20822 Contact with and (suspected) exposure to covid-19: Secondary | ICD-10-CM | POA: Diagnosis not present

## 2020-12-12 DIAGNOSIS — K760 Fatty (change of) liver, not elsewhere classified: Secondary | ICD-10-CM | POA: Diagnosis not present

## 2020-12-12 DIAGNOSIS — R112 Nausea with vomiting, unspecified: Secondary | ICD-10-CM | POA: Diagnosis not present

## 2020-12-16 ENCOUNTER — Other Ambulatory Visit: Payer: Self-pay | Admitting: Neurology

## 2020-12-16 NOTE — Telephone Encounter (Signed)
Faxed printed/signed rx to pharmacy at [phone]. Received fax confirmation.

## 2021-01-21 ENCOUNTER — Other Ambulatory Visit: Payer: Self-pay | Admitting: Internal Medicine

## 2021-01-21 DIAGNOSIS — Z7252 High risk homosexual behavior: Secondary | ICD-10-CM

## 2021-01-26 ENCOUNTER — Other Ambulatory Visit: Payer: Self-pay | Admitting: Internal Medicine

## 2021-01-26 DIAGNOSIS — K7581 Nonalcoholic steatohepatitis (NASH): Secondary | ICD-10-CM

## 2021-01-29 ENCOUNTER — Ambulatory Visit: Payer: Medicare Other | Admitting: Family Medicine

## 2021-01-29 NOTE — Progress Notes (Deleted)
   I, Derrick Benitez, LAT, ATC acting as a scribe for Derrick Graham, MD.  Subjective:    CC: L calf/foot pain  HPI: Pt is a 40 y/o male c/o L calf/foot pain x /. MOI:? Pt locates pain to  Swelling: LE numbness/tingling: Aggravates: Treatments tried:  Pertinent review of Systems: ***  Relevant historical information: ***   Objective:   There were no vitals filed for this visit. General: Well Developed, well nourished, and in no acute distress.   MSK: ***  Lab and Radiology Results No results found for this or any previous visit (from the past 72 hour(s)). No results found.    Impression and Recommendations:    Assessment and Plan: 40 y.o. male with ***.  PDMP not reviewed this encounter. No orders of the defined types were placed in this encounter.  No orders of the defined types were placed in this encounter.   Discussed warning signs or symptoms. Please see discharge instructions. Patient expresses understanding.   ***

## 2021-02-03 NOTE — Progress Notes (Signed)
Subjective:    CC: L foot and calf pain  I, Derrick Benitez, LAT, ATC, am serving as scribe for Dr. Clementeen Graham.  HPI: Pt is a 40 y/o male presenting w/ L foot and calf pain ongoing since Aug 20th intermittently. Pt recalls running and stumbling, but doesn't recall the pain  He locates his symptoms to the plantar aspect of his L heel and travels to lateral aspect of his lower leg. Pt describes pain as  "crampy" pain and he notes a "pulsing" sensation when at rest. Worse when sitting/resting and transitioning to stand and first thing in the morning.  Swelling: no Aggravating factors: heel strike, ankle INV Treatments tried: changing shoes, orthotics  Pertinent review of Systems: No fevers or chills  Relevant historical information: On [place].  Deaf   Objective:    Vitals:   02/04/21 1024  BP: 138/88  Pulse: 72  SpO2: 97%   General: Well Developed, well nourished, and in no acute distress.   MSK: Left foot: Normal-appearing Tender palpation plantar calcaneus and plantar lateral midfoot and hindfoot.  Tender palpation along peroneal tendon laterally into the foot and into the proximal ankle. Pain with foot plantarflexion and eversion. Ankle strength intact but painful to plantarflexion.  Stable ligamentous exam. Pulses cap refill and sensation are intact distally.  Lab and Radiology Results  Diagnostic Limited MSK Ultrasound of: Left foot and ankle Plantar fascia thick at origin on calcaneus measuring greater than 4 mm without visible tear. Peroneal tendon normal-appearing but tender to palpation at insertion onto proximal fifth metatarsal. Normal bony structures lateral foot and ankle. Impression: Planter fasciitis and possible peroneal brevis tendinitis  X-ray images left ankle and calcaneus obtained today personally and independently interpreted  Left ankle: No acute fractures.  No significant degenerative changes.    Left calcaneus: No significant heel spur.  No  acute fractures.  Await formal radiology review    Impression and Recommendations:    Assessment and Plan: 40 y.o. male with plantar and lateral foot and ankle pain.  Pain thought to be due to Planter fasciitis and possible overuse pain lateral foot and peroneal tendinitis.  Plan for immobilization with a cam walker boot and eccentric exercises focused on Planter fasciitis and peroneal tendon.  Additionally treat with limited meloxicam.  Recheck in 1 month.  At that time we will try to increase home exercise program.  Today's visit was conducted using an American sign language interpreter using visual telemedicine.Marland Kitchen  PDMP not reviewed this encounter. Orders Placed This Encounter  Procedures   [place] LIMITED JOINT SPACE STRUCTURES LOW LEFT(NO LINKED CHARGES)    Standing Status:   Future    Number of Occurrences:   1    Standing Expiration Date:   08/05/2021    Order Specific Question:   Reason for Exam (SYMPTOM  OR DIAGNOSIS REQUIRED)    Answer:   left lower leg pain    Order Specific Question:   Preferred imaging location?    Answer:   [facility] Ankle Complete Left    Standing Status:   Future    Number of Occurrences:   1    Standing Expiration Date:   02/04/2022    Order Specific Question:   Reason for Exam (SYMPTOM  OR DIAGNOSIS REQUIRED)    Answer:   left ankle pain    Order Specific Question:   Preferred imaging location?    Answer:   [facility]   DG  Os [facility]cis Left    Standing Status:   Future    Number of Occurrences:   1    Standing Expiration Date:   02/04/2022    Order Specific Question:   Reason for Exam (SYMPTOM  OR DIAGNOSIS REQUIRED)    Answer:   left heel pain    Order Specific Question:   Preferred imaging location?    Answer:   [facility]   Meds ordered this encounter  Medications   DISCONTD: meloxicam (MOBIC) 15 MG tablet    Sig: Take 1 tablet (15 mg total) by mouth daily.    Dispense:  30 tablet    Refill:   0    Discussed warning signs or symptoms. Please see discharge instructions. Patient expresses understanding.   The above documentation has been reviewed and is accurate and complete Clementeen Graham, M.D.

## 2021-02-04 ENCOUNTER — Other Ambulatory Visit: Payer: Self-pay | Admitting: Family Medicine

## 2021-02-04 ENCOUNTER — Ambulatory Visit (INDEPENDENT_AMBULATORY_CARE_PROVIDER_SITE_OTHER): Payer: Medicare Other

## 2021-02-04 ENCOUNTER — Ambulatory Visit (INDEPENDENT_AMBULATORY_CARE_PROVIDER_SITE_OTHER): Payer: Medicare Other | Admitting: Family Medicine

## 2021-02-04 ENCOUNTER — Ambulatory Visit: Payer: Self-pay

## 2021-02-04 ENCOUNTER — Other Ambulatory Visit: Payer: Self-pay

## 2021-02-04 VITALS — BP 138/88 | HR 72 | Ht 70.0 in | Wt 234.0 lb

## 2021-02-04 DIAGNOSIS — M722 Plantar fascial fibromatosis: Secondary | ICD-10-CM | POA: Diagnosis not present

## 2021-02-04 DIAGNOSIS — M25572 Pain in left ankle and joints of left foot: Secondary | ICD-10-CM | POA: Diagnosis not present

## 2021-02-04 DIAGNOSIS — S99922A Unspecified injury of left foot, initial encounter: Secondary | ICD-10-CM | POA: Diagnosis not present

## 2021-02-04 DIAGNOSIS — M79662 Pain in left lower leg: Secondary | ICD-10-CM

## 2021-02-04 DIAGNOSIS — S99912A Unspecified injury of left ankle, initial encounter: Secondary | ICD-10-CM | POA: Diagnosis not present

## 2021-02-04 DIAGNOSIS — M79672 Pain in left foot: Secondary | ICD-10-CM | POA: Diagnosis not present

## 2021-02-04 MED ORDER — MELOXICAM 15 MG PO TABS: 15.0000 mg | ORAL_TABLET | Freq: Every day | ORAL | 0 refills | Status: DC

## 2021-02-04 NOTE — Patient Instructions (Addendum)
Thank you for coming in today.   Please go to [facility] supply to get the Cam Dan Humphreys we talked about today. You may also be able to get it from [employer].    Please get an Xray today before you leave   Return in 1 month with ASL interpreter.   Please complete the exercises that the athletic trainer went over with you:  View at [email or web address] using code: [email or web address]  Meloxicam as needed for pain.

## 2021-02-08 NOTE — Progress Notes (Signed)
Left heel x-ray shows a little bit of arthritis changes in the foot.  Otherwise looks okay.

## 2021-02-08 NOTE — Progress Notes (Signed)
Left ankle x-ray is normal to radiology

## 2021-03-03 NOTE — Progress Notes (Signed)
   I, Christoper Fabian, LAT, ATC, am serving as scribe for Dr. Clementeen Graham.  Derrick Benitez is a 40 y.o. male who presents to [facility] today for f/u of L plantar fasciitis and L lower leg pain.  He was last seen by Dr. Denyse Amass on 02/04/21 and was advised to use a CAM walker boot and to do eccentric strengthening exercises.  He was also prescribed Meloxicam.  Today, pt reports pain has improved somewhat, but it's still bothersome. Pt notes benefit from wearing the boot, but pain without. Pt locates pain to the L heel and along the lateral aspect of foot. Pt expressed confusion about the results from the XR and is wondering about arthritis.  No fevers or chills  Diagnostic testing: L heel and L ankle XR- 02/04/21  Pertinent review of systems: No fevers or chills  Relevant historical information: Hypertension. He owns a Customer service manager.   Exam:  BP 130/86   Pulse 71   Ht 5\' 10"  (1.778 m)   Wt 240 lb 9.6 oz (109.1 kg)   SpO2 97%   BMI 34.52 kg/m  General: Well Developed, well nourished, and in no acute distress.   MSK: Left foot normal-appearing Tender palpation plantar calcaneus.  Mildly tender palpation proximal fifth metatarsal.  Nontender at lateral malleolus. Normal foot and ankle motion. Intact strength. Stable ligamentous exam.    Lab and Radiology Results  EXAM: LEFT OS CALCIS - 2+ VIEW   COMPARISON:  None.   FINDINGS: Mild subtalar degenerative changes. No fracture or dislocation. No focal lytic or sclerotic lesion.   IMPRESSION: No acute findings.     Electronically Signed   By: M.D.   On: 02/06/2021 09:29    EXAM: LEFT ANKLE COMPLETE - 3+ VIEW   COMPARISON:  None.   FINDINGS: There is no evidence of fracture, dislocation, or joint effusion. There is no evidence of arthropathy or other focal bone abnormality. Soft tissues are unremarkable.   IMPRESSION: Negative.     Electronically Signed   By: 02/08/2021  M.D. I, Elberta Fortis, personally (independently) visualized and performed the interpretation of the images attached in this note.   Assessment and Plan: 40 y.o. male with left foot and ankle pain primarily due to Planter fasciitis.  Patient also has some pain at the proximal fifth metatarsal and originally also had pain at the peroneal tendon.  He has had improvement with immobilization with a cam walker boot.  However at this point it is time to wean out of the boot and start strengthening.  Work on home exercises especially eccentric Alfredson's type exercises.  These were taught in clinic by ATC.  Also recommend ice massage and gel heel cushion.  Check back in 1 month.  If not improving consider injection or even MRI.  MRI would be for plantar fasciitis but also to evaluate the proximal fifth metatarsal as well as for peroneal tendinopathy.  Recheck 1 month as above.  Today's visit conducted using an American sign which interpreter.     Discussed warning signs or symptoms. Please see discharge instructions. Patient expresses understanding.   The above documentation has been reviewed and is accurate and complete 41, M.D. Total encounter time 30 minutes including face-to-face time with the patient and, reviewing past medical record, and charting on the date of service.   Reviewed imaging discussed treatment plan and options and discussed medical diagnosis.

## 2021-03-08 ENCOUNTER — Other Ambulatory Visit: Payer: Self-pay

## 2021-03-08 ENCOUNTER — Ambulatory Visit (INDEPENDENT_AMBULATORY_CARE_PROVIDER_SITE_OTHER): Payer: Medicare Other | Admitting: Family Medicine

## 2021-03-08 VITALS — BP 130/86 | HR 71 | Ht 70.0 in | Wt 240.6 lb

## 2021-03-08 DIAGNOSIS — M79672 Pain in left foot: Secondary | ICD-10-CM

## 2021-03-08 DIAGNOSIS — M722 Plantar fascial fibromatosis: Secondary | ICD-10-CM

## 2021-03-08 DIAGNOSIS — M7672 Peroneal tendinitis, left leg: Secondary | ICD-10-CM | POA: Diagnosis not present

## 2021-03-08 NOTE — Patient Instructions (Addendum)
Thank you for coming in today.   Ice massage  Try to progress to get out of the boot  Gel heel insoles  Please complete the exercises that the athletic trainer went over with you:  View at [email or web address] using code: [email or web address]  Recheck back in 1 month

## 2021-03-09 ENCOUNTER — Encounter: Payer: Self-pay | Admitting: Internal Medicine

## 2021-03-09 ENCOUNTER — Ambulatory Visit (INDEPENDENT_AMBULATORY_CARE_PROVIDER_SITE_OTHER): Payer: Medicare Other | Admitting: Internal Medicine

## 2021-03-09 VITALS — BP 124/84 | HR 76 | Temp 97.8°F | Resp 16 | Ht 70.0 in | Wt 236.0 lb

## 2021-03-09 DIAGNOSIS — I10 Essential (primary) hypertension: Secondary | ICD-10-CM | POA: Diagnosis not present

## 2021-03-09 DIAGNOSIS — Z7252 High risk homosexual behavior: Secondary | ICD-10-CM | POA: Diagnosis not present

## 2021-03-09 DIAGNOSIS — K580 Irritable bowel syndrome with diarrhea: Secondary | ICD-10-CM | POA: Diagnosis not present

## 2021-03-09 DIAGNOSIS — Z23 Encounter for immunization: Secondary | ICD-10-CM | POA: Diagnosis not present

## 2021-03-09 DIAGNOSIS — A510 Primary genital syphilis: Secondary | ICD-10-CM

## 2021-03-09 DIAGNOSIS — R768 Other specified abnormal immunological findings in serum: Secondary | ICD-10-CM

## 2021-03-09 DIAGNOSIS — Z114 Encounter for screening for human immunodeficiency virus [HIV]: Secondary | ICD-10-CM | POA: Diagnosis not present

## 2021-03-09 LAB — URINALYSIS, ROUTINE W REFLEX MICROSCOPIC
Bilirubin Urine: NEGATIVE
Ketones, ur: NEGATIVE
Leukocytes,Ua: NEGATIVE
Nitrite: NEGATIVE
Specific Gravity, Urine: >=1.03 — AB (ref 1.000–1.030)
Total Protein, Urine: NEGATIVE
Urine Glucose: NEGATIVE
Urobilinogen, UA: 0.2 (ref 0.0–1.0)
pH: 5.5 (ref 5.0–8.0)

## 2021-03-09 LAB — BASIC METABOLIC PANEL
BUN: 20 mg/dL (ref 6–23)
CO2: 29 mEq/L (ref 19–32)
Calcium: 9.5 mg/dL (ref 8.4–10.5)
Chloride: 103 mEq/L (ref 96–112)
Creatinine, Ser: 0.98 mg/dL (ref 0.40–1.50)
GFR: 96.37 mL/min (ref >60.00)
Glucose, Bld: 127 mg/dL — ABNORMAL HIGH (ref 70–99)
Potassium: 4.1 mEq/L (ref 3.5–5.1)
Sodium: 138 mEq/L (ref 135–145)

## 2021-03-09 MED ORDER — VIBERZI 75 MG PO TABS: 1.0000 | ORAL_TABLET | Freq: Two times a day (BID) | ORAL | 0 refills | Status: DC | PRN

## 2021-03-09 NOTE — Patient Instructions (Signed)
Safe Sex Practicing safe sex means taking steps before and during sex to reduce your risk of: Getting an STI (sexually transmitted infection). Giving your partner an STI. Unwanted or unplanned pregnancy. How to practice safe sex Ways you can practice safe sex  Limit your sexual partners to only one partner who is having sex with only you. Avoid using alcohol and drugs before having sex. Alcohol and drugs can affect your judgment. Before having sex with a new partner: Talk to your partner about past partners, past STIs, and drug use. Get screened for STIs and discuss the results with your partner. Ask your partner to get screened too. Check your body regularly for sores, blisters, rashes, or unusual discharge. If you notice any of these problems, visit your health care provider. Avoid sexual contact if you have symptoms of an infection or you are being treated for an STI. While having sex, use a condom. Make sure to: Use a condom every time you have vaginal, oral, or anal sex. Both females and males should wear condoms during oral sex. Keep condoms in place from the beginning to the end of sexual activity. Use a latex condom, if possible. Latex condoms offer the best protection. Use only water-based lubricants with a condom. Using petroleum-based lubricants or oils will weaken the condom and increase the chance that it will break. Ways your health care provider can help you practice safe sex  See your health care provider for regular screenings, exams, and tests for STIs. Talk with your health care provider about what kind of birth control (contraception) is best for you. Get vaccinated against hepatitis B and human papillomavirus (HPV). If you are at risk of being infected with HIV (human immunodeficiency virus), talk with your health care provider about taking a prescription medicine to prevent HIV infection. You are at risk for HIV if you: Are a man who has sex with other men. Are  sexually active with more than one partner. Take drugs by injection. Have a sex partner who has HIV. Have unprotected sex. Have sex with someone who has sex with both men and women. Have had an STI. Follow these instructions at home: Take over-the-counter and prescription medicines only as told by your health care provider. Keep all follow-up visits. This is important. Where to find more information Centers for Disease Control and Prevention: www.cdc.gov Planned Parenthood: www.plannedparenthood.org Office on Women's Health: www.womenshealth.gov Summary Practicing safe sex means taking steps before and during sex to reduce your risk getting an STI, giving your partner an STI, and having an unwanted or unplanned pregnancy. Before having sex with a new partner, talk to your partner about past partners, past STIs, and drug use. Use a condom every time you have vaginal, oral, or anal sex. Both females and males should wear condoms during oral sex. Check your body regularly for sores, blisters, rashes, or unusual discharge. If you notice any of these problems, visit your health care provider. See your health care provider for regular screenings, exams, and tests for STIs. This information is not intended to replace advice given to you by your health care provider. Make sure you discuss any questions you have with your health care provider. Document Revised: 09/02/2019 Document Reviewed: 09/02/2019 Elsevier Patient Education  2022 Elsevier Inc.  

## 2021-03-09 NOTE — Progress Notes (Signed)
Subjective:  Patient ID: Derrick Benitez, male    DOB: 10/12/1980  Age: 40 y.o. MRN: [id number]  CC: Hypertension  This visit occurred during the SARS-CoV-2 public health emergency.  Safety protocols were in place, including screening questions prior to the visit, additional usage of staff PPE, and extensive cleaning of exam room while observing appropriate contact time as indicated for disinfecting solutions.    Hx obtained using a sign interpreter.  HPI Derrick Benitez presents for f/up -   He continues to c/o frequent BM's but tells me that viberzi has helped.  Outpatient Medications Prior to Visit  Medication Sig Dispense Refill   meloxicam (MOBIC) 15 MG tablet TAKE 1 TABLET(15 MG) BY MOUTH DAILY 90 tablet 1   pioglitazone (ACTOS) 15 MG tablet TAKE 1 TABLET(15 MG) BY MOUTH DAILY 90 tablet 1   triamterene-hydrochlorothiazide (DYAZIDE) 37.5-25 MG capsule Take 1 each (1 capsule total) by mouth daily. Must make overdue follow up appt for further refills. Call [phone] 30 capsule 0   cyclobenzaprine (FLEXERIL) 5 MG tablet Take 1 tablet (5 mg total) by mouth 3 (three) times daily as needed for muscle spasms. 30 tablet 1   diazepam (VALIUM) 2 MG tablet Take 1 tablet (2 mg total) by mouth every 8 (eight) hours as needed for anxiety. 45 tablet 1   Eluxadoline (VIBERZI) 75 MG TABS Take 1 tablet by mouth 2 (two) times daily as needed. 180 tablet 0   emtricitabine-tenofovir AF (DESCOVY) 200-25 MG tablet Take 1 tablet by mouth daily. 90 tablet 1   hydrOXYzine (ATARAX/VISTARIL) 25 MG tablet Take 1 tablet (25 mg total) by mouth every 8 (eight) hours as needed. 30 tablet 0   No facility-administered medications prior to visit.    ROS Review of Systems  Constitutional:  Negative for chills, diaphoresis, fatigue and fever.  HENT: Negative.  Negative for sore throat.   Eyes: Negative.   Respiratory:  Negative for cough, shortness of breath and wheezing.   Cardiovascular:  Negative for chest pain,  palpitations and leg swelling.  Gastrointestinal:  Negative for abdominal pain, constipation, diarrhea, nausea and vomiting.  Endocrine: Negative.   Genitourinary: Negative.  Negative for difficulty urinating, dysuria, genital sores, penile discharge, scrotal swelling and testicular pain.  Musculoskeletal: Negative.   Skin: Negative.  Negative for color change and rash.  Neurological: Negative.  Negative for dizziness and weakness.  Hematological:  Negative for adenopathy. Does not bruise/bleed easily.  Psychiatric/Behavioral: Negative.     Objective:  BP 124/84 (BP Location: Right Arm, Patient Position: Sitting, Cuff Size: Large)   Pulse 76   Temp 97.8 F (36.6 C) (Oral)   Resp 16   Ht 5\' 10"  (1.778 m)   Wt 236 lb (107 kg)   SpO2 96%   BMI 33.86 kg/m   BP Readings from Last 3 Encounters:  03/09/21 124/84  03/08/21 130/86  02/04/21 138/88    Wt Readings from Last 3 Encounters:  03/09/21 236 lb (107 kg)  03/08/21 240 lb 9.6 oz (109.1 kg)  02/04/21 234 lb (106.1 kg)    Physical Exam Vitals reviewed.  Constitutional:      Appearance: He is not ill-appearing.  HENT:     Nose: Nose normal.     Mouth/Throat:     Mouth: Mucous membranes are moist.  Eyes:     General: No scleral icterus.    Conjunctiva/sclera: Conjunctivae normal.  Cardiovascular:     Rate and Rhythm: Normal rate and regular rhythm.     Heart  sounds: No murmur heard. Pulmonary:     Effort: Pulmonary effort is normal.     Breath sounds: No stridor. No wheezing, rhonchi or rales.  Abdominal:     General: Abdomen is flat.     Palpations: There is no mass.     Tenderness: There is no abdominal tenderness. There is no guarding.  Musculoskeletal:        General: No tenderness or deformity. Normal range of motion.     Cervical back: Neck supple.  Lymphadenopathy:     Cervical: No cervical adenopathy.  Skin:    General: Skin is warm and dry.     Findings: No rash.  Neurological:     General: No focal  deficit present.     Mental Status: He is alert.  Psychiatric:        Mood and Affect: Mood normal.        Behavior: Behavior normal.    Lab Results  Component Value Date   WBC 7.2 01/16/2020   HGB 15.2 01/16/2020   HCT 44.5 01/16/2020   PLT 298.0 01/16/2020   GLUCOSE 127 (H) 03/09/2021   ALT 45 04/21/2020   AST 32 04/21/2020   NA 138 03/09/2021   K 4.1 03/09/2021   CL 103 03/09/2021   CREATININE 0.98 03/09/2021   BUN 20 03/09/2021   CO2 29 03/09/2021    MR CERVICAL SPINE WO CONTRAST  Result Date: 06/10/2020  [facility] [address], [address] [place], [place] [address] [phone] NEUROIMAGING REPORT STUDY DATE: 06/10/2020 PATIENT NAME: Derrick Benitez DOB: 1980/09/05 MRN: [id number] EXAM: MRI of the cervical spine ORDERING CLINICIAN: Richard A. Sater, MD. PhD CLINICAL HISTORY: 40 year old man with an abnormal brain MRI, dysesthesias and neck pain COMPARISON FILMS: None TECHNIQUE: MRI of the cervical spine was obtained utilizing 3 mm sagittal slices from the posterior fossa down to the T3-4 level with T1, T2 and inversion recovery views. In addition 4 mm axial slices from C2-3 down to T1-2 level were included with T2 and gradient echo views. CONTRAST: None IMAGING SITE: [facility], [address] [place], [place], [place] FINDINGS: :  On sagittal images, the spine is imaged from above the cervicomedullary junction to T2.   Visible brain appears normal.  Paravertebral soft tissue appears normal.  The spinal cord is of normal caliber and signal.    Minimal reversal of the cervical curvature..  There is no spondylolisthesis.   The vertebral bodies have normal signal.  The discs and interspaces were further evaluated on axial views from C2 to T1 as follows: C2 - C3:  The disc and interspace appear normal. C3 - C4: There is minimal disc bulging.  There is no foraminal narrowing or spinal stenosis.  No nerve root compression.  C4 - C5:  There is minimal disc bulging.  There is no  foraminal narrowing or spinal stenosis.  No nerve root compression.  C5 - C6:  The disc and interspace appear normal. C6 - C7:  There is minimal disc bulging.  There is no foraminal narrowing or spinal stenosis.  No nerve root compression.  C7 - T1:  The disc and interspace appear normal.   This MRI of the cervical spine without contrast shows the following: 1.   The spinal cord appears normal.  There is no evidence of demyelination. 2.   Minimal disc degenerative changes at C3-C4 and C4-C5 and C6-C7 that does not cause spinal stenosis or nerve root compression. INTERPRETING PHYSICIAN: Richard A. Epimenio Foot, MD, PhD,  FAAN Certified in  Neuroimaging by [employer]    Assessment & Plan:   Derrick Benitez was seen today for hypertension.  Diagnoses and all orders for this visit:  Primary hypertension- His BP is well controlled. 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 No treatment indicated. 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 -     HIV Antibody (routine testing w rflx); Future -     Hepatitis B surface antigen; Future -     Hepatitis B surface antigen -     HIV Antibody (routine testing w rflx) -     emtricitabine-tenofovir AF (DESCOVY) 200-25 MG tablet; Take 1 tablet by mouth daily.  Irritable bowel syndrome with diarrhea -     Eluxadoline (VIBERZI) 75 MG TABS; Take 1 tablet by mouth 2 (two) times daily as  needed.  Other orders -     Flu Vaccine QUAD 6+ mos PF IM (Fluarix Quad PF) -     Rpr titer -     Fluorescent treponemal ab(fta)-IgG-bld  I have discontinued Derrick Benitez's hydrOXYzine, diazepam, and cyclobenzaprine. I am also having him maintain his pioglitazone, triamterene-hydrochlorothiazide, meloxicam, Viberzi, and Descovy.  Meds ordered this encounter  Medications   Eluxadoline (VIBERZI) 75 MG TABS    Sig: Take 1 tablet by mouth 2 (two) times daily as needed.    Dispense:  180 tablet    Refill:  0   emtricitabine-tenofovir AF (DESCOVY) 200-25 MG tablet    Sig: Take 1 tablet by mouth daily.    Dispense:  90 tablet    Refill:  0     Follow-up: Return in about 3 months (around 06/08/2021).  Sanda Linger, MD

## 2021-03-11 LAB — HEPATITIS B SURFACE ANTIGEN: Hepatitis B Surface Ag: NONREACTIVE

## 2021-03-11 LAB — RPR: RPR Ser Ql: REACTIVE — AB

## 2021-03-11 LAB — FLUORESCENT TREPONEMAL AB(FTA)-IGG-BLD: Fluorescent Treponemal ABS: REACTIVE — AB

## 2021-03-11 LAB — RPR TITER: RPR Titer: 1:1 {titer} — ABNORMAL HIGH

## 2021-03-11 LAB — HIV ANTIBODY (ROUTINE TESTING W REFLEX): HIV 1&2 Ab, 4th Generation: NONREACTIVE

## 2021-03-11 MED ORDER — DESCOVY 200-25 MG PO TABS: 1.0000 | ORAL_TABLET | Freq: Every day | ORAL | 0 refills | Status: DC

## 2021-04-13 NOTE — Progress Notes (Signed)
° °  I, Christoper Fabian, LAT, ATC, am serving as scribe for Dr. Clementeen Graham.  Derrick Benitez is a 41 y.o. male who presents to [facility] today for f/u of L plantar fasciitis and peroneal tendinitis.  He was last seen by Dr. Denyse Amass on 03/08/21 and was advised to wean out of his CAM walker boot and to begin Alfredson's exercises.  Today, pt reports improvement when walking. Pt has been working on his HEP. Pt notes continued pain when first standing up. Pt locates pain to the middle of his arch and into heel along the plantar aspect. Pt d/c wearing the brace.  Overall symptoms are improving but not improved enough.  Diagnostic testing: L heel and L ankle XR- 02/04/21  Pertinent review of systems: No fevers or chills  Relevant historical information: Hypertension.  Deaf   Exam:  BP (!) 148/90    Pulse 69    Ht 5\' 10"  (1.778 m)    Wt 240 lb (108.9 kg)    SpO2 97%    BMI 34.44 kg/m  General: Well Developed, well nourished, and in no acute distress.   MSK: Left foot tender palpation plantar fascial.  Normal foot and ankle motion.    Lab and Radiology Results  Procedure: Real-time Ultrasound Guided Injection of left plantar fascial insertion calcaneus Device: Philips Affiniti 50G Images permanently stored and available for review in PACS Verbal informed consent obtained.  Discussed risks and benefits of procedure. Warned about infection bleeding damage to structures skin hypopigmentation and fat atrophy among others. Patient expresses understanding and agreement Time-out conducted.   Noted no overlying erythema, induration, or other signs of local infection.   Skin prepped in a sterile fashion.   Local anesthesia: Topical Ethyl chloride.   With sterile technique and under real time ultrasound guidance: 40 mg of Kenalog and 2 mL of Marcaine injected into plantar fascial insertion calcaneus. Fluid seen entering the insertion site.   Completed without difficulty   Pain  immediately resolved suggesting accurate placement of the medication.   Advised to call if fevers/chills, erythema, induration, drainage, or persistent bleeding.   Images permanently stored and available for review in the ultrasound unit.  Impression: Technically successful ultrasound guided injection.         Assessment and Plan: 41 y.o. male with left plantar fasciitis.  Improved with conservative management strategies.  Plan for steroid injections today continued conservative management strategies.  Recheck back as needed.  Today's visit conducted using an American sign language interpreter.   PDMP not reviewed this encounter. Orders Placed This Encounter  Procedures   41 LIMITED JOINT SPACE STRUCTURES LOW LEFT(NO LINKED CHARGES)    Standing Status:   Future    Number of Occurrences:   1    Standing Expiration Date:   10/12/2021    Order Specific Question:   Reason for Exam (SYMPTOM  OR DIAGNOSIS REQUIRED)    Answer:   left foot pain    Order Specific Question:   Preferred imaging location?    Answer:   [facility]   No orders of the defined types were placed in this encounter.    Discussed warning signs or symptoms. Please see discharge instructions. Patient expresses understanding.   The above documentation has been reviewed and is accurate and complete 12/13/2021, M.D.

## 2021-04-14 ENCOUNTER — Ambulatory Visit (INDEPENDENT_AMBULATORY_CARE_PROVIDER_SITE_OTHER): Payer: Medicare Other | Admitting: Family Medicine

## 2021-04-14 ENCOUNTER — Other Ambulatory Visit: Payer: Self-pay

## 2021-04-14 ENCOUNTER — Ambulatory Visit: Payer: Self-pay

## 2021-04-14 VITALS — BP 148/90 | HR 69 | Ht 70.0 in | Wt 240.0 lb

## 2021-04-14 DIAGNOSIS — M722 Plantar fascial fibromatosis: Secondary | ICD-10-CM

## 2021-04-14 NOTE — Patient Instructions (Addendum)
Thank you for coming in today.   You received a steroid injection today. Seek immediate medical attention if the joint becomes red, extremely painful, or is oozing fluid.   Continue the exercises and stretches at home.   Recheck back as needed

## 2021-06-14 ENCOUNTER — Other Ambulatory Visit: Payer: Self-pay | Admitting: Internal Medicine

## 2021-06-14 DIAGNOSIS — Z7252 High risk homosexual behavior: Secondary | ICD-10-CM

## 2021-07-29 ENCOUNTER — Other Ambulatory Visit: Payer: Self-pay | Admitting: Family Medicine

## 2021-07-29 ENCOUNTER — Ambulatory Visit (INDEPENDENT_AMBULATORY_CARE_PROVIDER_SITE_OTHER): Payer: Medicare Other | Admitting: Internal Medicine

## 2021-07-29 ENCOUNTER — Encounter: Payer: Self-pay | Admitting: Internal Medicine

## 2021-07-29 VITALS — BP 132/88 | HR 69 | Temp 98.0°F | Resp 16 | Ht 70.0 in | Wt 232.0 lb

## 2021-07-29 DIAGNOSIS — K580 Irritable bowel syndrome with diarrhea: Secondary | ICD-10-CM

## 2021-07-29 DIAGNOSIS — Z114 Encounter for screening for human immunodeficiency virus [HIV]: Secondary | ICD-10-CM | POA: Diagnosis not present

## 2021-07-29 DIAGNOSIS — A510 Primary genital syphilis: Secondary | ICD-10-CM | POA: Diagnosis not present

## 2021-07-29 DIAGNOSIS — I10 Essential (primary) hypertension: Secondary | ICD-10-CM | POA: Diagnosis not present

## 2021-07-29 DIAGNOSIS — Z7252 High risk homosexual behavior: Secondary | ICD-10-CM

## 2021-07-29 LAB — CBC WITH DIFFERENTIAL/PLATELET
Basophils Absolute: 0.1 10*3/uL (ref 0.0–0.1)
Basophils Relative: 1.3 % (ref 0.0–3.0)
Eosinophils Absolute: 0.1 10*3/uL (ref 0.0–0.7)
Eosinophils Relative: 1.9 % (ref 0.0–5.0)
HCT: 46 % (ref 39.0–52.0)
Hemoglobin: 15.9 g/dL (ref 13.0–17.0)
Lymphocytes Relative: 32.9 % (ref 12.0–46.0)
Lymphs Abs: 2.4 10*3/uL (ref 0.7–4.0)
MCHC: 34.5 g/dL (ref 30.0–36.0)
MCV: 88.1 fl (ref 78.0–100.0)
Monocytes Absolute: 0.8 10*3/uL (ref 0.1–1.0)
Monocytes Relative: 10.8 % (ref 3.0–12.0)
Neutro Abs: 3.9 10*3/uL (ref 1.4–7.7)
Neutrophils Relative %: 53.1 % (ref 43.0–77.0)
Platelets: 246 10*3/uL (ref 150.0–400.0)
RBC: 5.23 Mil/uL (ref 4.22–5.81)
RDW: 13 % (ref 11.5–15.5)
WBC: 7.3 10*3/uL (ref 4.0–10.5)

## 2021-07-29 MED ORDER — VIBERZI 75 MG PO TABS: 1.0000 | ORAL_TABLET | Freq: Two times a day (BID) | ORAL | 0 refills | Status: DC | PRN

## 2021-07-29 NOTE — Patient Instructions (Signed)
Safe Sex Practicing safe sex means taking steps before and during sex to reduce your risk of: Getting an STI (sexually transmitted infection). Giving your partner an STI. Unwanted or unplanned pregnancy. How to practice safe sex Ways you can practice safe sex  Limit your sexual partners to only one partner who is having sex with only you. Avoid using alcohol and drugs before having sex. Alcohol and drugs can affect your judgment. Before having sex with a new partner: Talk to your partner about past partners, past STIs, and drug use. Get screened for STIs and discuss the results with your partner. Ask your partner to get screened too. Check your body regularly for sores, blisters, rashes, or unusual discharge. If you notice any of these problems, visit your health care provider. Avoid sexual contact if you have symptoms of an infection or you are being treated for an STI. While having sex, use a condom. Make sure to: Use a condom every time you have vaginal, oral, or anal sex. Both females and males should wear condoms during oral sex. Keep condoms in place from the beginning to the end of sexual activity. Use a latex condom, if possible. Latex condoms offer the best protection. Use only water-based lubricants with a condom. Using petroleum-based lubricants or oils will weaken the condom and increase the chance that it will break. Ways your health care provider can help you practice safe sex  See your health care provider for regular screenings, exams, and tests for STIs. Talk with your health care provider about what kind of birth control (contraception) is best for you. Get vaccinated against hepatitis B and human papillomavirus (HPV). If you are at risk of being infected with HIV (human immunodeficiency virus), talk with your health care provider about taking a prescription medicine to prevent HIV infection. You are at risk for HIV if you: Are a man who has sex with other men. Are  sexually active with more than one partner. Take drugs by injection. Have a sex partner who has HIV. Have unprotected sex. Have sex with someone who has sex with both men and women. Have had an STI. Follow these instructions at home: Take over-the-counter and prescription medicines only as told by your health care provider. Keep all follow-up visits. This is important. Where to find more information Centers for Disease Control and Prevention: www.cdc.gov Planned Parenthood: www.plannedparenthood.org Office on Women's Health: www.womenshealth.gov Summary Practicing safe sex means taking steps before and during sex to reduce your risk getting an STI, giving your partner an STI, and having an unwanted or unplanned pregnancy. Before having sex with a new partner, talk to your partner about past partners, past STIs, and drug use. Use a condom every time you have vaginal, oral, or anal sex. Both females and males should wear condoms during oral sex. Check your body regularly for sores, blisters, rashes, or unusual discharge. If you notice any of these problems, visit your health care provider. See your health care provider for regular screenings, exams, and tests for STIs. This information is not intended to replace advice given to you by your health care provider. Make sure you discuss any questions you have with your health care provider. Document Revised: 09/02/2019 Document Reviewed: 09/02/2019 Elsevier Patient Education  2023 Else[employer] Inc.  

## 2021-07-29 NOTE — Progress Notes (Signed)
? ?Subjective:  ?Patient ID: Derrick Benitez, male    DOB: 04/20/1980  Age: 41 y.o. MRN: [id number] ? ?CC: Hypertension ? ? ?HPI ?Derrick Benitez presents for f/up - ? ?He feels well and offers no complaints. ? ?Outpatient Medications Prior to Visit  ?Medication Sig Dispense Refill  ? meloxicam (MOBIC) 15 MG tablet TAKE 1 TABLET(15 MG) BY MOUTH DAILY 90 tablet 1  ? pioglitazone (ACTOS) 15 MG tablet TAKE 1 TABLET(15 MG) BY MOUTH DAILY 90 tablet 1  ? triamterene-hydrochlorothiazide (DYAZIDE) 37.5-25 MG capsule Take 1 each (1 capsule total) by mouth daily. Must make overdue follow up appt for further refills. Call [phone] 30 capsule 0  ? Eluxadoline (VIBERZI) 75 MG TABS Take 1 tablet by mouth 2 (two) times daily as needed. 180 tablet 0  ? emtricitabine-tenofovir AF (DESCOVY) 200-25 MG tablet Take 1 tablet by mouth daily. 90 tablet 0  ? ?No facility-administered medications prior to visit.  ? ? ?ROS ?Review of Systems  ?Constitutional:  Negative for chills, diaphoresis, fatigue and fever.  ?HENT:  Negative for sore throat and trouble swallowing.   ?Eyes: Negative.   ?Respiratory:  Negative for cough and shortness of breath.   ?Cardiovascular:  Negative for chest pain, palpitations and leg swelling.  ?Gastrointestinal:  Negative for abdominal pain, diarrhea and nausea.  ?Endocrine: Negative.   ?Genitourinary: Negative.  Negative for difficulty urinating.  ?Musculoskeletal:  Negative for myalgias.  ?Skin:  Negative for rash.  ?Neurological:  Negative for dizziness, weakness and headaches.  ?Hematological:  Negative for adenopathy. Does not bruise/bleed easily.  ?Psychiatric/Behavioral: Negative.    ? ?Objective:  ?BP 132/88 (BP Location: Right Arm, Patient Position: Sitting, Cuff Size: Large)   Pulse 69   Temp 98 ?F (36.7 ?C) (Oral)   Resp 16   Ht 5\' 10"  (1.778 m)   Wt 232 lb (105.2 kg)   SpO2 94%   BMI 33.29 kg/m?  ? ?BP Readings from Last 3 Encounters:  ?07/29/21 132/88  ?04/14/21 (!) 148/90  ?03/09/21 124/84  ? ? ?Wt  Readings from Last 3 Encounters:  ?07/29/21 232 lb (105.2 kg)  ?04/14/21 240 lb (108.9 kg)  ?03/09/21 236 lb (107 kg)  ? ? ?Physical Exam ?Vitals reviewed.  ?Constitutional:   ?   Appearance: Normal appearance.  ?HENT:  ?   Nose: Nose normal.  ?   Mouth/Throat:  ?   Mouth: Mucous membranes are moist.  ?Eyes:  ?   General: No scleral icterus. ?Cardiovascular:  ?   Rate and Rhythm: Normal rate and regular rhythm.  ?   Heart sounds: No murmur heard. ?Pulmonary:  ?   Effort: Pulmonary effort is normal.  ?   Breath sounds: No stridor. No wheezing, rhonchi or rales.  ?Abdominal:  ?   General: Bowel sounds are normal.  ?   Palpations: There is no mass.  ?   Tenderness: There is no abdominal tenderness. There is no guarding.  ?   Hernia: No hernia is present.  ?Musculoskeletal:     ?   General: Normal range of motion.  ?   Cervical back: Neck supple.  ?   Right lower leg: No edema.  ?   Left lower leg: No edema.  ?Lymphadenopathy:  ?   Cervical: No cervical adenopathy.  ?Skin: ?   General: Skin is warm.  ?Neurological:  ?   General: No focal deficit present.  ?   Mental Status: He is alert.  ?Psychiatric:     ?   Mood  and Affect: Mood normal.     ?   Behavior: Behavior normal.  ? ? ?Lab Results  ?Component Value Date  ? WBC 7.3 07/29/2021  ? HGB 15.9 07/29/2021  ? HCT 46.0 07/29/2021  ? PLT 246.0 07/29/2021  ? GLUCOSE 127 (H) 03/09/2021  ? ALT 45 04/21/2020  ? AST 32 04/21/2020  ? NA 138 03/09/2021  ? K 4.1 03/09/2021  ? CL 103 03/09/2021  ? CREATININE 0.98 03/09/2021  ? BUN 20 03/09/2021  ? CO2 29 03/09/2021  ? ? ?MR CERVICAL SPINE WO CONTRAST ? ?Result Date: 06/10/2020 ? [facility] [address] [place], [place] [address] [phone] NEUROIMAGING REPORT STUDY DATE: 06/10/2020 PATIENT NAME: Jenard Willimas DOB: 1980/11/03 MRN: [id number] EXAM: MRI of the cervical spine ORDERING CLINICIAN: Richard A. Sater, MD. PhD CLINICAL HISTORY: 41 year old man with an abnormal brain MRI, dysesthesias and neck pain  COMPARISON FILMS: None TECHNIQUE: MRI of the cervical spine was obtained utilizing 3 mm sagittal slices from the posterior fossa down to the T3-4 level with T1, T2 and inversion recovery views. In addition 4 mm axial slices from [id number] down to T1-2 level were included with T2 and gradient echo views. CONTRAST: None IMAGING SITE: [facility], [place], [place], [place] FINDINGS: :  On sagittal images, the spine is imaged from above the cervicomedullary junction to T2.   Visible brain appears normal.  Paravertebral soft tissue appears normal.  The spinal cord is of normal caliber and signal.    Minimal reversal of the cervical curvature..  There is no spondylolisthesis.   The vertebral bodies have normal signal.  The discs and interspaces were further evaluated on axial views from C2 to T1 as follows: C2 - C3:  The disc and interspace appear normal. C3 - C4: There is minimal disc bulging.  There is no foraminal narrowing or spinal stenosis.  No nerve root compression.  C4 - C5:  There is minimal disc bulging.  There is no foraminal narrowing or spinal stenosis.  No nerve root compression.  C5 - C6:  The disc and interspace appear normal. C6 - C7:  There is minimal disc bulging.  There is no foraminal narrowing or spinal stenosis.  No nerve root compression.  C7 - T1:  The disc and interspace appear normal.  ? ?This MRI of the cervical spine without contrast shows the following: 1.   The spinal cord appears normal.  There is no evidence of demyelination. 2.   Minimal disc degenerative changes at C3-C4 and C4-C5 and C6-C7 that does not cause spinal stenosis or nerve root compression. INTERPRETING PHYSICIAN: Richard A. Felecia Shelling, MD, PhD, FAAN Certified in  [place] by [employer]  ? ? ?Assessment & Plan:  ? ?Derrick Benitez was seen today for hypertension. ? ?Diagnoses and all orders for this visit: ? ?Primary hypertension- His blood pressure is adequately well controlled. ?-     CBC with  Differential/Platelet; Future ?-     CBC with Differential/Platelet ? ?Encounter for screening for HIV ?-     HIV Antibody (routine testing w rflx); Future ?-     HIV Antibody (routine testing w rflx) ? ?Primary genital syphilis- His RPR is negative now. ?-     HIV Antibody (routine testing w rflx); Future ?-     RPR; Future ?-     RPR ?-     HIV Antibody (routine testing w rflx) ? ?High risk homosexual behavior- HIV and hep B antigen are  negative.  He can continue taking PrEP. ?-     HIV Antibody (routine testing w rflx); Future ?-     RPR; Future ?-     Hepatitis B surface antigen; Future ?-     Hepatitis B surface antigen ?-     RPR ?-     HIV Antibody (routine testing w rflx) ?-     emtricitabine-tenofovir AF (DESCOVY) 200-25 MG tablet; Take 1 tablet by mouth daily. ? ?Irritable bowel syndrome with diarrhea ?-     Eluxadoline (VIBERZI) 75 MG TABS; Take 1 tablet by mouth 2 (two) times daily as needed. ? ? ?I am having Derrick Benitez maintain his pioglitazone, triamterene-hydrochlorothiazide, meloxicam, Viberzi, and Descovy. ? ?Meds ordered this encounter  ?Medications  ? Eluxadoline (VIBERZI) 75 MG TABS  ?  Sig: Take 1 tablet by mouth 2 (two) times daily as needed.  ?  Dispense:  180 tablet  ?  Refill:  0  ? emtricitabine-tenofovir AF (DESCOVY) 200-25 MG tablet  ?  Sig: Take 1 tablet by mouth daily.  ?  Dispense:  90 tablet  ?  Refill:  0  ? ? ? ?Follow-up: Return in about 3 months (around 10/28/2021). ? ?Scarlette Calico, MD ?

## 2021-07-30 ENCOUNTER — Telehealth: Payer: Self-pay | Admitting: Internal Medicine

## 2021-07-30 LAB — RPR: RPR Ser Ql: NONREACTIVE

## 2021-07-30 LAB — HIV ANTIBODY (ROUTINE TESTING W REFLEX): HIV 1&2 Ab, 4th Generation: NONREACTIVE

## 2021-07-30 LAB — HEPATITIS B SURFACE ANTIGEN: Hepatitis B Surface Ag: NONREACTIVE

## 2021-07-30 NOTE — Telephone Encounter (Signed)
Left message for patient to call back to schedule Medicare Annual Wellness Visit   No hx of AWV eligible as of 04/11/09  Please schedule at anytime with [facility] Advisor if patient calls the office back.    45 Minutes appointment   Any questions, please call me at [phone]  

## 2021-07-31 MED ORDER — DESCOVY 200-25 MG PO TABS: 1.0000 | ORAL_TABLET | Freq: Every day | ORAL | 0 refills | Status: DC

## 2021-08-03 DIAGNOSIS — R109 Unspecified abdominal pain: Secondary | ICD-10-CM | POA: Diagnosis not present

## 2021-08-03 DIAGNOSIS — S335XXA Sprain of ligaments of lumbar spine, initial encounter: Secondary | ICD-10-CM | POA: Diagnosis not present

## 2021-08-03 DIAGNOSIS — K429 Umbilical hernia without obstruction or gangrene: Secondary | ICD-10-CM | POA: Diagnosis not present

## 2021-08-03 DIAGNOSIS — K760 Fatty (change of) liver, not elsewhere classified: Secondary | ICD-10-CM | POA: Diagnosis not present

## 2021-08-03 DIAGNOSIS — K4020 Bilateral inguinal hernia, without obstruction or gangrene, not specified as recurrent: Secondary | ICD-10-CM | POA: Diagnosis not present

## 2021-08-03 DIAGNOSIS — R1032 Left lower quadrant pain: Secondary | ICD-10-CM | POA: Diagnosis not present

## 2021-09-08 NOTE — Progress Notes (Unsigned)
   I, Wendy Poet, LAT, ATC, am serving as scribe for Dr. Lynne Benitez.  Derrick Benitez is a 41 y.o. male who presents to [place] at [facility] today for f/u of continued L heel pain due to plantar fascitis. Pt was last seen by Dr. Georgina Snell on 04/14/21 and was given a L plantar fascial steroid injection and advised to cont conservative management strategies. Today, pt reports that his pain returned a few weeks ago for no known reason.  He states that he has been doing his HEP daily.  Dx imaging: 02/04/21 L os calcis and L ankle XR  Pertinent review of systems: No fevers or chills  Relevant historical information: Hypertension   Exam:  BP 112/80 (BP Location: Right Arm, Patient Position: Sitting, Cuff Size: Large)   Pulse 61   Ht 5\' 10"  (1.778 m)   Wt 229 lb 9.6 oz (104.1 kg)   SpO2 94%   BMI 32.94 kg/m  General: Well Developed, well nourished, and in no acute distress.   MSK: Left calcaneus tender palpation plantar calcaneus.  Normal foot and ankle motion.    Lab and Radiology Results  Procedure: Real-time Ultrasound Guided Injection of left plantar fascia origin plantar calcaneus Device: Philips Affiniti 50G Images permanently stored and available for review in PACS Verbal informed consent obtained.  Discussed risks and benefits of procedure. Warned about infection, bleeding, hyperglycemia damage to structures among others. Patient expresses understanding and agreement Time-out conducted.   Noted no overlying erythema, induration, or other signs of local infection.   Skin prepped in a sterile fashion.   Local anesthesia: Topical Ethyl chloride.   With sterile technique and under real time ultrasound guidance: 40 mg of Kenalog and 2 mL of Marcaine injected into plantar fascia origin. Fluid seen entering the plantar fascia.   Completed without difficulty   Pain immediately resolved suggesting accurate placement of the medication.   Advised to call if  fevers/chills, erythema, induration, drainage, or persistent bleeding.   Images permanently stored and available for review in the ultrasound unit.  Impression: Technically successful ultrasound guided injection.        Assessment and Plan: 41 y.o. male with left plantar fasciitis recurrence.  Plan for steroid injection today.  Continue home exercise program.  Recheck back as needed.  Could consider shockwave therapy if not improved.   PDMP not reviewed this encounter. Orders Placed This Encounter  Procedures   [place] LIMITED JOINT SPACE STRUCTURES LOW LEFT(NO LINKED CHARGES)    Order Specific Question:   Reason for Exam (SYMPTOM  OR DIAGNOSIS REQUIRED)    Answer:   PF inj    Order Specific Question:   Preferred imaging location?    Answer:   [place]   No orders of the defined types were placed in this encounter.    Discussed warning signs or symptoms. Please see discharge instructions. Patient expresses understanding.   The above documentation has been reviewed and is accurate and complete Derrick Benitez, M.D.

## 2021-09-09 ENCOUNTER — Ambulatory Visit: Payer: Medicare Other

## 2021-09-09 ENCOUNTER — Ambulatory Visit (INDEPENDENT_AMBULATORY_CARE_PROVIDER_SITE_OTHER): Payer: Medicare Other | Admitting: Family Medicine

## 2021-09-09 ENCOUNTER — Encounter: Payer: Self-pay | Admitting: Family Medicine

## 2021-09-09 VITALS — BP 112/80 | HR 61 | Ht 70.0 in | Wt 229.6 lb

## 2021-09-09 DIAGNOSIS — M722 Plantar fascial fibromatosis: Secondary | ICD-10-CM | POA: Diagnosis not present

## 2021-09-09 NOTE — Patient Instructions (Addendum)
Good to see you today.  You had an injection in your L heel.  Call or go to the ER if you develop a large red swollen joint with extreme pain or oozing puss.   Keep doing your slow heel lowers off the edge of the step.   Follow-up: as needed

## 2021-09-16 ENCOUNTER — Ambulatory Visit (INDEPENDENT_AMBULATORY_CARE_PROVIDER_SITE_OTHER): Payer: Medicare Other | Admitting: Family Medicine

## 2021-09-16 ENCOUNTER — Encounter: Payer: Self-pay | Admitting: Internal Medicine

## 2021-09-16 ENCOUNTER — Encounter: Payer: Self-pay | Admitting: Family Medicine

## 2021-09-16 ENCOUNTER — Ambulatory Visit (INDEPENDENT_AMBULATORY_CARE_PROVIDER_SITE_OTHER): Payer: Medicare Other | Admitting: Internal Medicine

## 2021-09-16 VITALS — BP 124/78 | HR 84 | Temp 97.7°F | Ht 70.0 in | Wt 226.0 lb

## 2021-09-16 VITALS — BP 124/78 | HR 84 | Ht 70.0 in | Wt 226.0 lb

## 2021-09-16 DIAGNOSIS — E785 Hyperlipidemia, unspecified: Secondary | ICD-10-CM | POA: Diagnosis not present

## 2021-09-16 DIAGNOSIS — A510 Primary genital syphilis: Secondary | ICD-10-CM | POA: Diagnosis not present

## 2021-09-16 DIAGNOSIS — Z114 Encounter for screening for human immunodeficiency virus [HIV]: Secondary | ICD-10-CM

## 2021-09-16 DIAGNOSIS — I10 Essential (primary) hypertension: Secondary | ICD-10-CM

## 2021-09-16 DIAGNOSIS — M79672 Pain in left foot: Secondary | ICD-10-CM | POA: Diagnosis not present

## 2021-09-16 DIAGNOSIS — Z7252 High risk homosexual behavior: Secondary | ICD-10-CM | POA: Diagnosis not present

## 2021-09-16 DIAGNOSIS — R739 Hyperglycemia, unspecified: Secondary | ICD-10-CM

## 2021-09-16 DIAGNOSIS — M722 Plantar fascial fibromatosis: Secondary | ICD-10-CM

## 2021-09-16 DIAGNOSIS — F322 Major depressive disorder, single episode, severe without psychotic features: Secondary | ICD-10-CM | POA: Diagnosis not present

## 2021-09-16 LAB — BASIC METABOLIC PANEL
BUN: 19 mg/dL (ref 6–23)
CO2: 29 mEq/L (ref 19–32)
Calcium: 9.7 mg/dL (ref 8.4–10.5)
Chloride: 102 mEq/L (ref 96–112)
Creatinine, Ser: 1.14 mg/dL (ref 0.40–1.50)
GFR: 80.08 mL/min (ref >60.00)
Glucose, Bld: 92 mg/dL (ref 70–99)
Potassium: 3.9 mEq/L (ref 3.5–5.1)
Sodium: 140 mEq/L (ref 135–145)

## 2021-09-16 LAB — LDL CHOLESTEROL, DIRECT: Direct LDL: 91 mg/dL

## 2021-09-16 LAB — TSH: TSH: 1.09 u[IU]/mL (ref 0.35–5.50)

## 2021-09-16 LAB — URINALYSIS, ROUTINE W REFLEX MICROSCOPIC
Hgb urine dipstick: NEGATIVE
Ketones, ur: NEGATIVE
Leukocytes,Ua: NEGATIVE
Nitrite: NEGATIVE
RBC / HPF: NONE SEEN (ref 0–?)
Specific Gravity, Urine: >=1.03 — AB (ref 1.000–1.030)
Total Protein, Urine: NEGATIVE
Urine Glucose: NEGATIVE
Urobilinogen, UA: 0.2 (ref 0.0–1.0)
pH: 5.5 (ref 5.0–8.0)

## 2021-09-16 LAB — LIPID PANEL
Cholesterol: 143 mg/dL (ref 0–200)
HDL: 36.4 mg/dL — ABNORMAL LOW (ref >39.00)
NonHDL: 106.26
Total CHOL/HDL Ratio: 4
Triglycerides: 202 mg/dL — ABNORMAL HIGH (ref 0.0–149.0)
VLDL: 40.4 mg/dL — ABNORMAL HIGH (ref 0.0–40.0)

## 2021-09-16 LAB — CBC WITH DIFFERENTIAL/PLATELET
Basophils Absolute: 0.1 10*3/uL (ref 0.0–0.1)
Basophils Relative: 0.8 % (ref 0.0–3.0)
Eosinophils Absolute: 0.1 10*3/uL (ref 0.0–0.7)
Eosinophils Relative: 0.7 % (ref 0.0–5.0)
HCT: 47.9 % (ref 39.0–52.0)
Hemoglobin: 16.2 g/dL (ref 13.0–17.0)
Lymphocytes Relative: 22.9 % (ref 12.0–46.0)
Lymphs Abs: 2.1 10*3/uL (ref 0.7–4.0)
MCHC: 33.9 g/dL (ref 30.0–36.0)
MCV: 87.7 fl (ref 78.0–100.0)
Monocytes Absolute: 1 10*3/uL (ref 0.1–1.0)
Monocytes Relative: 11.6 % (ref 3.0–12.0)
Neutro Abs: 5.7 10*3/uL (ref 1.4–7.7)
Neutrophils Relative %: 64 % (ref 43.0–77.0)
Platelets: 294 10*3/uL (ref 150.0–400.0)
RBC: 5.46 Mil/uL (ref 4.22–5.81)
RDW: 13.5 % (ref 11.5–15.5)
WBC: 8.9 10*3/uL (ref 4.0–10.5)

## 2021-09-16 LAB — HEMOGLOBIN A1C: Hgb A1c MFr Bld: 6.4 % (ref 4.6–6.5)

## 2021-09-16 MED ORDER — VORTIOXETINE HBR 5 MG PO TABS: 5.0000 mg | ORAL_TABLET | Freq: Every day | ORAL | 0 refills | Status: DC

## 2021-09-16 NOTE — Progress Notes (Signed)
   I, Christoper Fabian, LAT, ATC, am serving as scribe for Dr. Clementeen Graham.  Derrick Benitez is a 41 y.o. male who presents to [facility] today for continued L foot pain. Pt was last seen by Dr. Denyse Amass on 09/09/21 and was given a L plantar fascia steroid injection and advised to cont HEP. Today, pt reports that he felt a pop in the plantar aspect of his foot while walking at home.  He states that he felt a pop and a tearing type sensation.  He reports throbbing pain in his foot and notes that the pain has remained constant since it got worse.  Pain is severe currently.  Patient has been treated for [place] fasciitis since February 04, 2021. Dx imaging: 02/04/21 L os calcis and L ankle XR  Pertinent review of systems: No fevers or chills  Relevant historical information: Hypertension   Exam:  BP 124/78 (BP Location: Right Arm, Patient Position: Sitting, Cuff Size: Large)   Pulse 84   Ht 5\' 10"  (1.778 m)   Wt 226 lb (102.5 kg)   SpO2 94%   BMI 32.43 kg/m  General: Well Developed, well nourished, and in no acute distress.   MSK: Left foot: Swollen and tender to palpation plantar calcaneus.    Lab and Radiology Results  EXAM: LEFT OS CALCIS - 2+ VIEW   COMPARISON:  None.   FINDINGS: Mild subtalar degenerative changes. No fracture or dislocation. No focal lytic or sclerotic lesion.   IMPRESSION: No acute findings.     Electronically Signed   By: M.D.   On: 02/06/2021 09:29 I, 02/08/2021, personally (independently) visualized and performed the interpretation of the images attached in this note.      Assessment and Plan: 41 y.o. male with left plantar heel pain.  This is an acute exacerbation of her chronic issue thought to be plantar fasciitis.  Current pain concerning for plantar fascia rupture.  Plan for MRI to further evaluate source of pain.  In the meantime recommend cam walker boot.   PDMP not reviewed this encounter. Orders  Placed This Encounter  Procedures   MR HEEL LEFT WO CONTRAST    Standing Status:   Future    Standing Expiration Date:   09/17/2022    Order Specific Question:   What is the patient's sedation requirement?    Answer:   No Sedation    Order Specific Question:   Does the patient have a pacemaker or implanted devices?    Answer:   No    Order Specific Question:   Preferred imaging location?    Answer:   11/17/2022 (table limit-350lbs)   No orders of the defined types were placed in this encounter.    Discussed warning signs or symptoms. Please see discharge instructions. Patient expresses understanding.   The above documentation has been reviewed and is accurate and complete Licensed conveyancer, M.D.
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
## 2021-09-16 NOTE — Patient Instructions (Signed)
Major Depressive Disorder, Adult Major depressive disorder (MDD) is a mental health condition. It may also be called clinical depression or unipolar depression. MDD causes symptoms of sadness, hopelessness, and loss of interest in things. These symptoms last most of the day, almost every day, for 2 weeks. MDD can also cause physical symptoms. It can interfere with relationships and with everyday activities, such as work, school, and activities that are usually pleasant. MDD may be mild, moderate, or severe. It may be single-episode MDD, which happens once, or recurrent MDD, which may occur multiple times. What are the causes? The exact cause of this condition is not known. MDD is most likely caused by a combination of things, which may include: Your personality traits. Learned or conditioned behaviors or thoughts or feelings that reinforce negativity. Any alcohol or substance misuse. Long-term (chronic) physical or mental health illness. Going through a traumatic experience or major life changes. What increases the risk? The following factors may make someone more likely to develop MDD: A family history of depression. Being a woman. Troubled family relationships. Abnormally low levels of certain brain chemicals. Traumatic or painful events in childhood, especially abuse or loss of a parent. A lot of stress from life experiences, such as poor living conditions or discrimination. Chronic physical illness or other mental health disorders. What are the signs or symptoms? The main symptoms of MDD usually include: Constant depressed or irritable mood. A loss of interest in things and activities. Other symptoms include: Sleeping or eating too much or too little. Unexplained weight gain or weight loss. Tiredness or low energy. Being agitated, restless, or weak. Feeling hopeless, worthless, or guilty. Trouble thinking clearly or making decisions. Thoughts of suicide or thoughts of harming  others. Isolating oneself or avoiding other people or activities. Trouble completing tasks, work, or any normal obligations. Severe symptoms of this condition may include: Psychotic depression.This may include false beliefs, or delusions. It may also include seeing, hearing, tasting, smelling, or feeling things that are not real (hallucinations). Chronic depression or persistent depressive disorder. This is low-level depression that lasts for at least 2 years. Melancholic depression, or feeling extremely sad and hopeless. Catatonic depression, which includes trouble speaking and trouble moving. How is this diagnosed? This condition may be diagnosed based on: Your symptoms. Your medical and mental health history. You may be asked questions about your lifestyle, including any drug and alcohol use. A physical exam. Blood tests to rule out other conditions. MDD is confirmed if you have the following symptoms most of the day, nearly every day, in a 2-week period: Either a depressed mood or loss of interest. At least four other MDD symptoms. How is this treated? This condition is usually treated by mental health professionals, such as psychologists, psychiatrists, and clinical social workers. You may need more than one type of treatment. Treatment may include: Psychotherapy, also called talk therapy or counseling. Types of psychotherapy include: Cognitive behavioral therapy (CBT). This teaches you to recognize unhealthy feelings, thoughts, and behaviors, and replace them with positive thoughts and actions. Interpersonal therapy (IPT). This helps you to improve the way you communicate with others or relate to them. Family therapy. This treatment includes members of your family. Medicines to treat anxiety and depression. These medicines help to balance the brain chemicals that affect your emotions. Lifestyle changes. You may be asked to: Limit alcohol use and avoid drug use. Get regular  exercise. Get plenty of sleep. Make healthy eating choices. Spend more time outdoors. Brain stimulation. This may   be done if symptoms are very severe and other treatments have not worked. Examples of this treatment are electroconvulsive therapy and transcranial magnetic stimulation. Follow these instructions at home: Activity Exercise regularly and spend time outdoors. Find activities that you enjoy doing, and make time to do them. Find healthy ways to manage stress, such as: Meditation or deep breathing. Spending time in nature. Journaling. Return to your normal activities as told by your health care provider. Ask your health care provider what activities are safe for you. Alcohol and drug use If you drink alcohol: Limit how much you use to: 0-1 drink a day for women who are not pregnant. 0-2 drinks a day for men. Be aware of how much alcohol is in your drink. In the [place] one drink equals one 12 oz bottle of beer (355 mL), one 5 oz glass of wine (148 mL), or one 1 oz glass of hard liquor (44 mL). Discuss your alcohol use with your health care provider. Alcohol can affect any antidepressant medicines you are taking. Discuss any drug use with your health care provider. General instructions  Take over-the-counter and prescription medicines only as told by your health care provider. Eat a healthy diet and get plenty of sleep. Consider joining a support group. Your health care provider may be able to recommend one. Keep all follow-up visits as told by your health care provider. This is important. Where to find more information National Alliance on Mental Illness: www.nami.org [place] National Institute of Mental Health: www.nimh.nih.gov Contact a health care provider if: Your symptoms get worse. You develop new symptoms. Get help right away if: You self-harm. You have serious thoughts about hurting yourself or others. You hallucinate. If you ever feel like you may hurt yourself or  others, or have thoughts about taking your own life, get help right away. Go to your nearest emergency department or: Call your local emergency services (911 in the [place] Call a suicide crisis helpline, such as the National Suicide Prevention Lifeline at 1-800-273-8255 or 988 in the [place] This is open 24 hours a day in the [place] Text the Crisis Text Line at 741741 (in the [place] Summary Major depressive disorder (MDD) is a mental health condition. MDD causes symptoms of sadness, hopelessness, and loss of interest in things. These symptoms last most of the day, almost every day, for 2 weeks. The symptoms of MDD can interfere with relationships and with everyday activities. Treatments and support are available for people who develop MDD. You may need more than one type of treatment. Get help right away if you have serious thoughts about hurting yourself or others. This information is not intended to replace advice given to you by your health care provider. Make sure you discuss any questions you have with your health care provider. Document Revised: 10/21/2020 Document Reviewed: 03/09/2019 Elsevier Patient Education  2023 Elsevier Inc.  

## 2021-09-16 NOTE — Patient Instructions (Addendum)
Good to see you today.  I've ordered an MRI of your L heel.  That facility will call you to schedule but if you don't hear from them in one week regarding scheduling, please let us know.  Follow-up: after MRI

## 2021-09-17 ENCOUNTER — Telehealth: Payer: Self-pay | Admitting: *Deleted

## 2021-09-17 NOTE — Chronic Care Management (AMB) (Signed)
  Care Management   Outreach Note  09/17/2021 Name: Derrick Benitez MRN: [id number] DOB: 1980-11-06  Referred by: Etta Grandchild, MD Reason for referral : Care Coordination (Initial outreach to schedule referral with Licensed Clinical SW)   An unsuccessful telephone outreach was attempted today. The patient was referred to the case management team for assistance with care management and care coordination.   Follow Up Plan:  A HIPAA compliant phone message was left for the patient providing contact information and requesting a return call.   [name], CCMA Care Guide, Embedded Care Coordination [facility]  Care Management  Direct Dial: [phone]

## 2021-09-18 LAB — HEPATITIS B SURFACE ANTIGEN: Hepatitis B Surface Ag: NONREACTIVE

## 2021-09-18 LAB — RPR TITER: RPR Titer: 1:1 {titer} — ABNORMAL HIGH

## 2021-09-18 LAB — RPR: RPR Ser Ql: REACTIVE — AB

## 2021-09-18 LAB — FLUORESCENT TREPONEMAL AB(FTA)-IGG-BLD: Fluorescent Treponemal ABS: REACTIVE — AB

## 2021-09-18 LAB — HIV ANTIBODY (ROUTINE TESTING W REFLEX): HIV 1&2 Ab, 4th Generation: NONREACTIVE

## 2021-09-23 NOTE — Chronic Care Management (AMB) (Unsigned)
  Care Management   Outreach Note  09/23/2021 Name: Derrick Benitez MRN: [id number] DOB: October 18, 1980  Referred by: Etta Grandchild, MD Reason for referral : Care Coordination (Initial outreach to schedule referral with Licensed Clinical SW)   A second unsuccessful telephone outreach was attempted today. The patient was referred to the case management team for assistance with care management and care coordination.   Follow Up Plan:  A HIPAA compliant phone message was left for the patient providing contact information and requesting a return call.   [name], CCMA Care Guide, Embedded Care Coordination [facility]  Care Management  Direct Dial: [phone]

## 2021-09-29 NOTE — Chronic Care Management (AMB) (Signed)
  Care Management   Outreach Note  09/29/2021 Name: Derrick Benitez MRN: [id number] DOB: [birth year]  Referred by: Etta Grandchild, MD Reason for referral : Care Coordination (Initial outreach to schedule referral with Licensed Clinical SW)   Third unsuccessful telephone outreach was attempted today. The patient was referred to the case management team for assistance with care management and care coordination. The patient's primary care provider has been notified of our unsuccessful attempts to make or maintain contact with the patient. The care management team is pleased to engage with this patient at any time in the future should he/she be interested in assistance from the care management team.   Follow Up Plan:  We have been unable to make contact with the patient for follow up. The care management team is available to follow up with the patient after provider conversation with the patient regarding recommendation for care management engagement and subsequent re-referral to the care management team.   [name], CCMA Care Guide, Embedded Care Coordination [facility]  Care Management  Direct Dial: [phone]

## 2021-10-07 ENCOUNTER — Telehealth: Payer: Self-pay | Admitting: Family Medicine

## 2021-10-07 NOTE — Telephone Encounter (Signed)
Noted  

## 2021-10-07 NOTE — Telephone Encounter (Signed)
Patient called to let Dr Denyse Amass know that he has been feeling better and does not want to continue with the MRI at this point.  Just FYI.

## 2021-10-27 ENCOUNTER — Other Ambulatory Visit: Payer: Self-pay | Admitting: Internal Medicine

## 2021-10-27 DIAGNOSIS — F322 Major depressive disorder, single episode, severe without psychotic features: Secondary | ICD-10-CM

## 2021-10-27 DIAGNOSIS — Z7252 High risk homosexual behavior: Secondary | ICD-10-CM

## 2021-10-27 MED ORDER — DESCOVY 200-25 MG PO TABS: 1.0000 | ORAL_TABLET | Freq: Every day | ORAL | 0 refills | Status: DC

## 2021-10-27 MED ORDER — VORTIOXETINE HBR 10 MG PO TABS: 10.0000 mg | ORAL_TABLET | Freq: Every day | ORAL | 0 refills | Status: DC

## 2021-11-26 DIAGNOSIS — F419 Anxiety disorder, unspecified: Secondary | ICD-10-CM | POA: Diagnosis not present

## 2022-01-24 ENCOUNTER — Other Ambulatory Visit: Payer: Self-pay | Admitting: Family Medicine

## 2022-01-24 ENCOUNTER — Other Ambulatory Visit: Payer: Self-pay | Admitting: Internal Medicine

## 2022-01-24 DIAGNOSIS — K580 Irritable bowel syndrome with diarrhea: Secondary | ICD-10-CM

## 2022-01-31 ENCOUNTER — Other Ambulatory Visit: Payer: Self-pay | Admitting: Internal Medicine

## 2022-01-31 DIAGNOSIS — Z7252 High risk homosexual behavior: Secondary | ICD-10-CM

## 2022-02-04 ENCOUNTER — Encounter: Payer: Self-pay | Admitting: Family Medicine

## 2022-02-04 ENCOUNTER — Ambulatory Visit (INDEPENDENT_AMBULATORY_CARE_PROVIDER_SITE_OTHER): Payer: Medicare Other | Admitting: Family Medicine

## 2022-02-04 VITALS — BP 130/82 | HR 82 | Temp 97.6°F | Ht 70.0 in | Wt 222.0 lb

## 2022-02-04 DIAGNOSIS — M5442 Lumbago with sciatica, left side: Secondary | ICD-10-CM | POA: Diagnosis not present

## 2022-02-04 DIAGNOSIS — M47816 Spondylosis without myelopathy or radiculopathy, lumbar region: Secondary | ICD-10-CM | POA: Diagnosis not present

## 2022-02-04 MED ORDER — KETOROLAC TROMETHAMINE 60 MG/2ML IM SOLN
60.0000 mg | Freq: Once | INTRAMUSCULAR | Status: AC
  Administered 2022-02-04: 60 mg via INTRAMUSCULAR

## 2022-02-04 MED ORDER — CYCLOBENZAPRINE HCL 5 MG PO TABS: 5.0000 mg | ORAL_TABLET | Freq: Every evening | ORAL | 0 refills | Status: DC | PRN

## 2022-02-04 MED ORDER — TRAMADOL HCL 50 MG PO TABS: 50.0000 mg | ORAL_TABLET | Freq: Three times a day (TID) | ORAL | 0 refills | Status: AC | PRN

## 2022-02-04 MED ORDER — PREDNISONE 10 MG (21) PO TBPK: ORAL_TABLET | Freq: Every day | ORAL | 0 refills | Status: DC

## 2022-02-04 NOTE — Progress Notes (Unsigned)
Subjective:     Patient ID: Derrick Benitez, male    DOB: 1980/10/03, 41 y.o.   MRN: [id number]  Chief Complaint  Patient presents with   Hip Pain    Lower left squeezing back pain, hurts to sit and hard to squat. Started yesterday morning.     HPI Patient is in today for left low back pain that radiates down his left leg.  Sign language interpreter is present.   Hx of DDD of lumbar spine.   Taking Tylenol and using heating pad.   No fever, chills, night sweats, chest pain, cough, shortness of breath,  abdominal pain, N/V/D, urinary symptoms.      Health Maintenance Due  Topic Date Due   Medicare Annual Wellness (AWV)  Never done   Hepatitis C Screening  Never done   COVID-19 Vaccine (3 - Pfizer series) 09/04/2019   INFLUENZA VACCINE  11/09/2021    Past Medical History:  Diagnosis Date   Deaf    Irritable bowel syndrome (IBS)     History reviewed. No pertinent surgical history.  Family History  Problem Relation Age of Onset   Stomach cancer Neg Hx    Colon cancer Neg Hx     Social History   Socioeconomic History   Marital status: Single    Spouse name: Not on file   Number of children: 0   Years of education: 12   Highest education level: Not on file  Occupational History   Occupation: [employer]  Tobacco Use   Smoking status: Never   Smokeless tobacco: Never  Vaping Use   Vaping Use: Never used  Substance and Sexual Activity   Alcohol use: Yes    Alcohol/week: 3.0 standard drinks of alcohol    Types: 3 Cans of beer per week    Comment: occ    Drug use: Not Currently    Types: Marijuana    Comment: Occ use   Sexual activity: Yes  Other Topics Concern   Not on file  Social History Narrative   Right handed   Caffeine use: Coke sometimes   Lives with roommate   Social Determinants of Health   Financial Resource Strain: Not on file  Food Insecurity: Not on file  Transportation Needs: Not on file  Physical Activity: Not on file  Stress: Not on  file  Social Connections: Not on file  Intimate Partner Violence: Not on file    Outpatient Medications Prior to Visit  Medication Sig Dispense Refill   emtricitabine-tenofovir AF (DESCOVY) 200-25 MG tablet Take 1 tablet by mouth daily. 90 tablet 0   meloxicam (MOBIC) 15 MG tablet TAKE 1 TABLET(15 MG) BY MOUTH DAILY 90 tablet 1   pioglitazone (ACTOS) 15 MG tablet TAKE 1 TABLET(15 MG) BY MOUTH DAILY 90 tablet 1   VIBERZI 75 MG TABS TAKE 1 TABLET BY MOUTH TWICE DAILY AS NEEDED 180 tablet 0   vortioxetine HBr (TRINTELLIX) 10 MG TABS tablet Take 1 tablet (10 mg total) by mouth daily. 90 tablet 0   No facility-administered medications prior to visit.    Allergies  Allergen Reactions   Valtrex [Valacyclovir Hcl] Rash    ROS     Objective:    Physical Exam Constitutional:      General: He is not in acute distress.    Comments: Visibly uncomfortable and unable to sit due to pain.   HENT:     Mouth/Throat:     Mouth: Mucous membranes are moist.  Eyes:  Conjunctiva/sclera: Conjunctivae normal.     Pupils: Pupils are equal, round, and reactive to light.  Musculoskeletal:     Cervical back: Normal.     Thoracic back: Normal.     Lumbar back: Tenderness present. Decreased range of motion.     Right lower leg: No edema.     Left lower leg: No edema.     Comments: TTP over left SI joint. Limited ROM of lumbar spine due to pain. Bilateral LEs are neurovascularly intact   Skin:    General: Skin is warm and dry.  Neurological:     General: No focal deficit present.     Mental Status: He is alert and oriented to person, place, and time.     Sensory: No sensory deficit.     Motor: No weakness.     Gait: Gait normal.  Psychiatric:        Mood and Affect: Mood normal.        Behavior: Behavior normal.     BP 130/82 (BP Location: Left Arm, Patient Position: Standing, Cuff Size: Large)   Pulse 82   Temp 97.6 F (36.4 C) (Temporal)   Ht 5\' 10"  (1.778 m)   Wt 222 lb (100.7 kg)    SpO2 96%   BMI 31.85 kg/m  Wt Readings from Last 3 Encounters:  02/04/22 222 lb (100.7 kg)  09/16/21 226 lb (102.5 kg)  09/16/21 226 lb (102.5 kg)       Assessment & Plan:   Problem List Items Addressed This Visit       Musculoskeletal and Integument   DJD (degenerative joint disease), lumbar   Relevant Medications   predniSONE (STERAPRED UNI-PAK 21 TAB) 10 MG (21) TBPK tablet   traMADol (ULTRAM) 50 MG tablet   cyclobenzaprine (FLEXERIL) 5 MG tablet   Other Visit Diagnoses     Acute left-sided low back pain with left-sided sciatica    -  Primary   Relevant Medications   predniSONE (STERAPRED UNI-PAK 21 TAB) 10 MG (21) TBPK tablet   traMADol (ULTRAM) 50 MG tablet   cyclobenzaprine (FLEXERIL) 5 MG tablet   ketorolac (TORADOL) injection 60 mg (Completed)      Visibly in pain and unable to sit.  Toradol 60 mg IM injection given today.  May start Tramadol and Flexeril this evening. Aware of the sedating effects. He has taken these medications in the past for back pain.  Oral prednisone dose pack to start tomorrow.  Follow up if worsening or not improving in the next 2- 3 days.  Out of work note provided until Monday 02/07/2022  I am having Derrick Benitez start on predniSONE, traMADol, and cyclobenzaprine. I am also having him maintain his pioglitazone, meloxicam, Descovy, vortioxetine HBr, and Viberzi. We administered ketorolac.  Meds ordered this encounter  Medications   predniSONE (STERAPRED UNI-PAK 21 TAB) 10 MG (21) TBPK tablet    Sig: Take by mouth daily.    Dispense:  21 tablet    Refill:  0    Order Specific Question:   Supervising Provider    Answer:   [name] A [[id number]]   traMADol (ULTRAM) 50 MG tablet    Sig: Take 1 tablet (50 mg total) by mouth every 8 (eight) hours as needed for up to 5 days.    Dispense:  15 tablet    Refill:  0    Order Specific Question:   Supervising Provider    Answer:   Hillard Danker A [[id number]]  cyclobenzaprine  (FLEXERIL) 5 MG tablet    Sig: Take 1 tablet (5 mg total) by mouth at bedtime as needed for muscle spasms.    Dispense:  30 tablet    Refill:  0    Order Specific Question:   Supervising Provider    Answer:   Hillard Danker A [[id number]]   ketorolac (TORADOL) injection 60 mg

## 2022-02-04 NOTE — Patient Instructions (Addendum)
You received a Toradol 60 mg IM injection in the office today.  This is an anti-inflammatory for pain.  You may take tramadol this evening if needed.  You may also take the muscle relaxant as needed but be aware this medication is sedating so do not drive or operate machinery if you take this.  You may start the steroids in the morning with breakfast and a full glass of water.  Continue using a heating pad.  You may also pick up over-the-counter topical pain medicine such as Salonpas with lidocaine, Biofreeze or lidocaine patches.  Follow-up if you are getting worse or not back to baseline when you complete the medications.

## 2022-02-27 IMAGING — DX DG OS CALCIS 2+V*L*
2 series · 2 of 2 positions shown · non-contrast
Comparison: None.

CLINICAL DATA: Left heel and ankle pain after injury running
11/28/2020.

EXAM:
LEFT OS CALCIS - 2+ VIEW

[calcaneus axial]
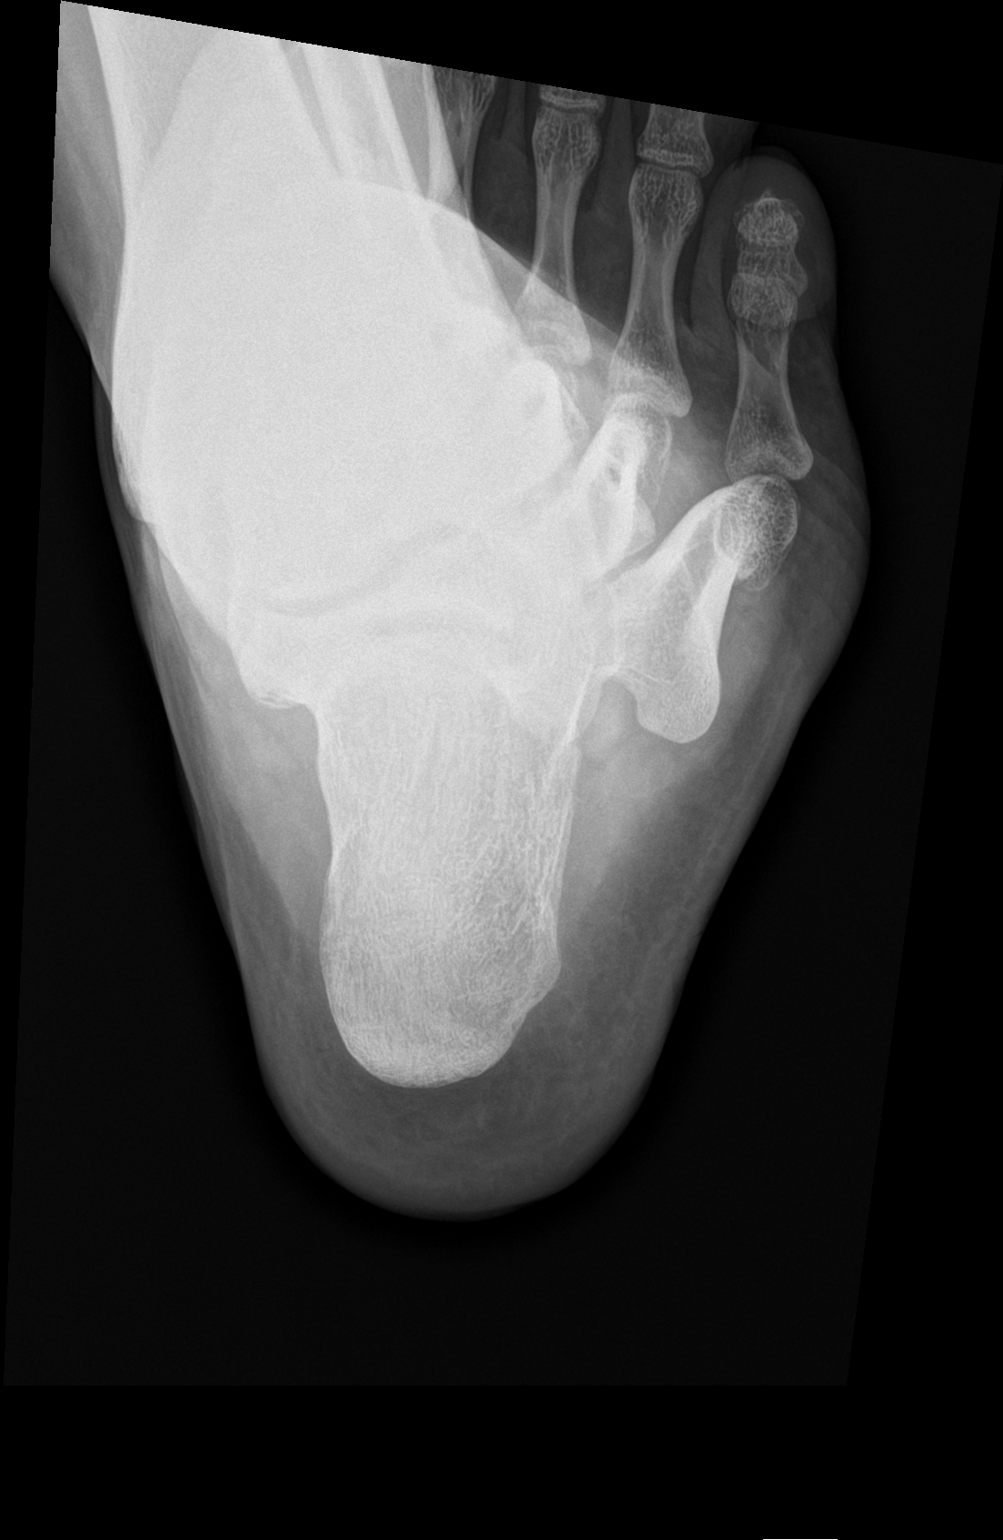

[calcaneus lat]
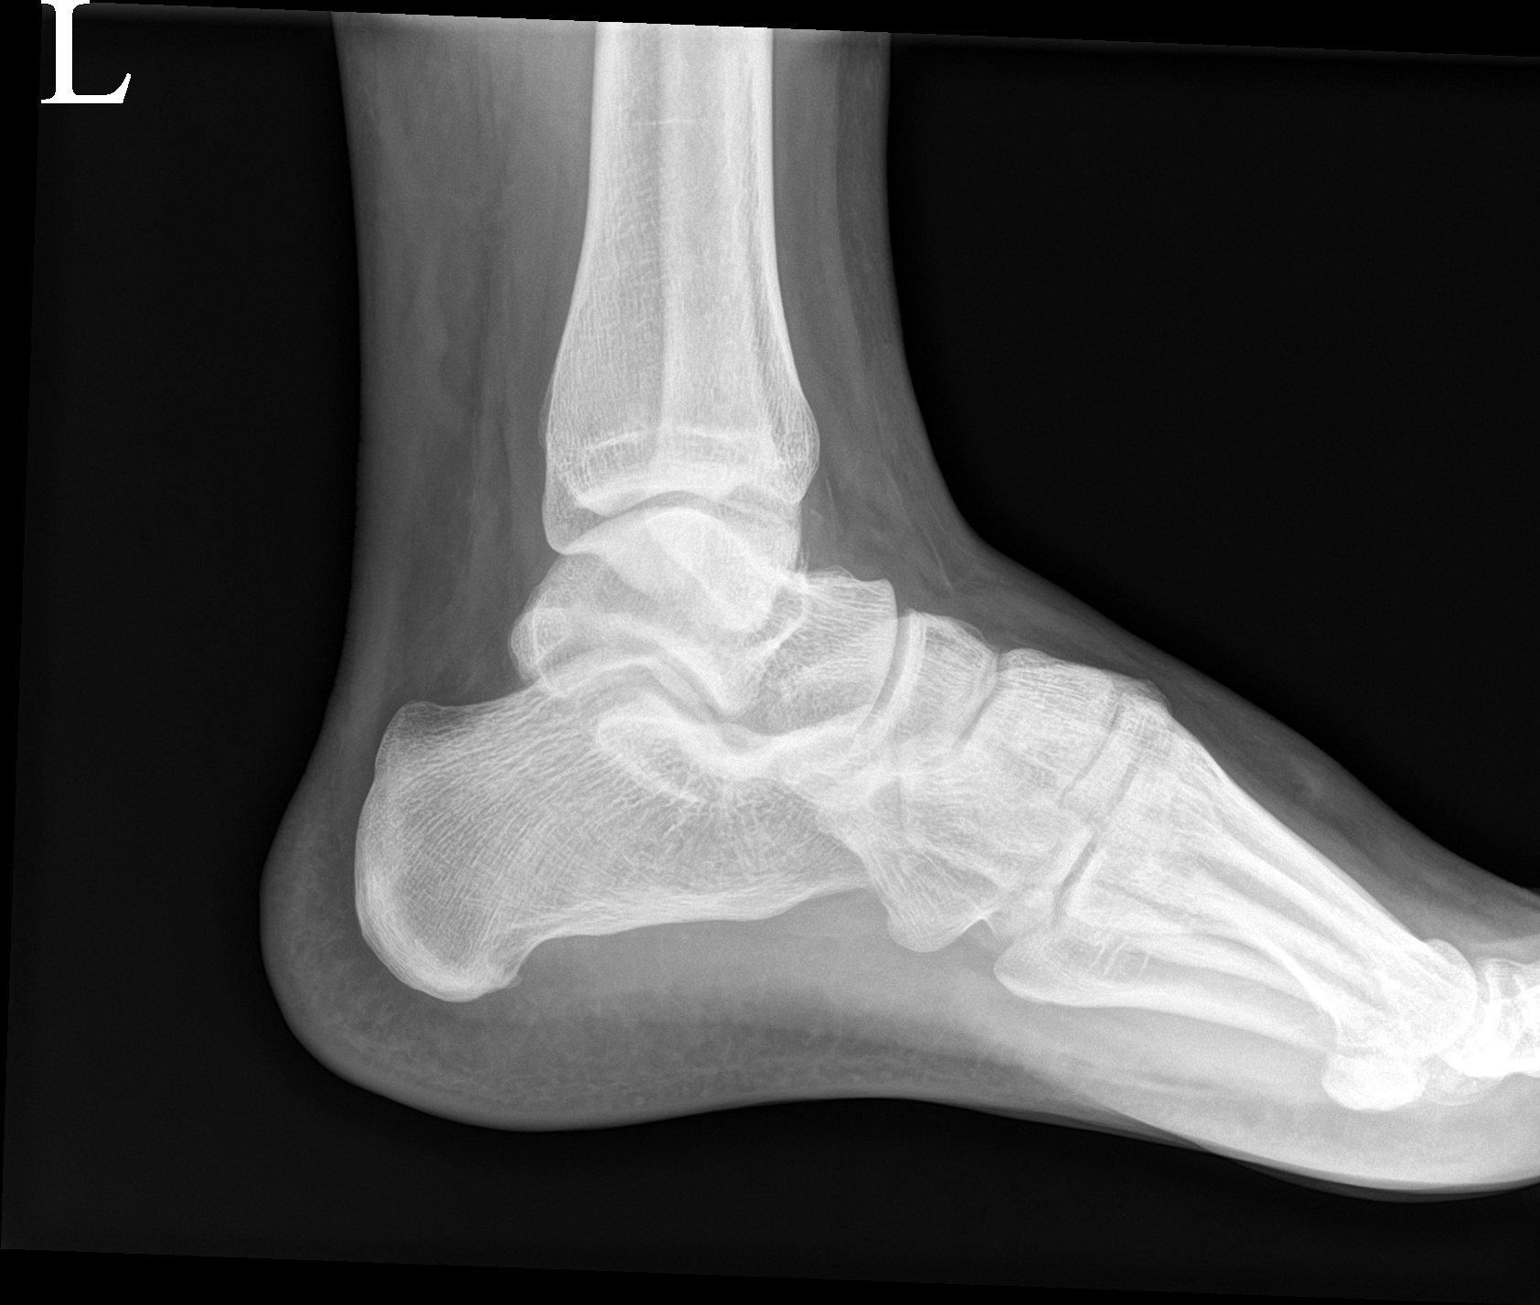

[2 of 2 positions shown; findings below may reference images not displayed]

FINDINGS: Mild subtalar degenerative changes. No fracture or dislocation. No
focal lytic or sclerotic lesion.
IMPRESSION: No acute findings.

## 2022-02-27 IMAGING — DX DG ANKLE COMPLETE 3+V*L*
3 series · 3 of 3 positions shown · non-contrast
Comparison: None.

CLINICAL DATA: Left ankle and heel pain after injury running
11/28/2020.

EXAM:
LEFT ANKLE COMPLETE - 3+ VIEW

[ankle ap]
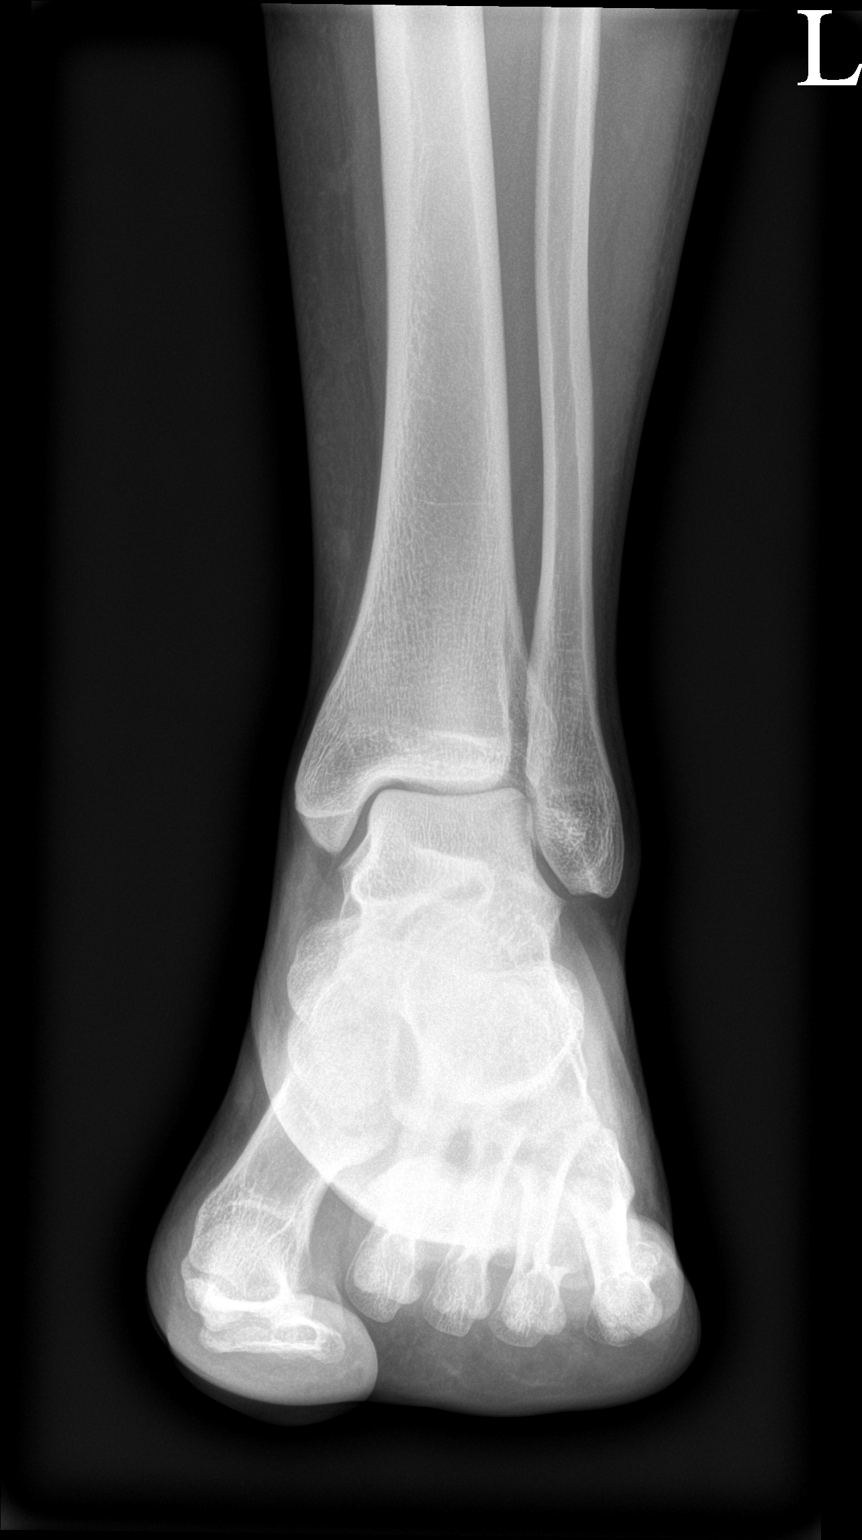

[ankle obl]
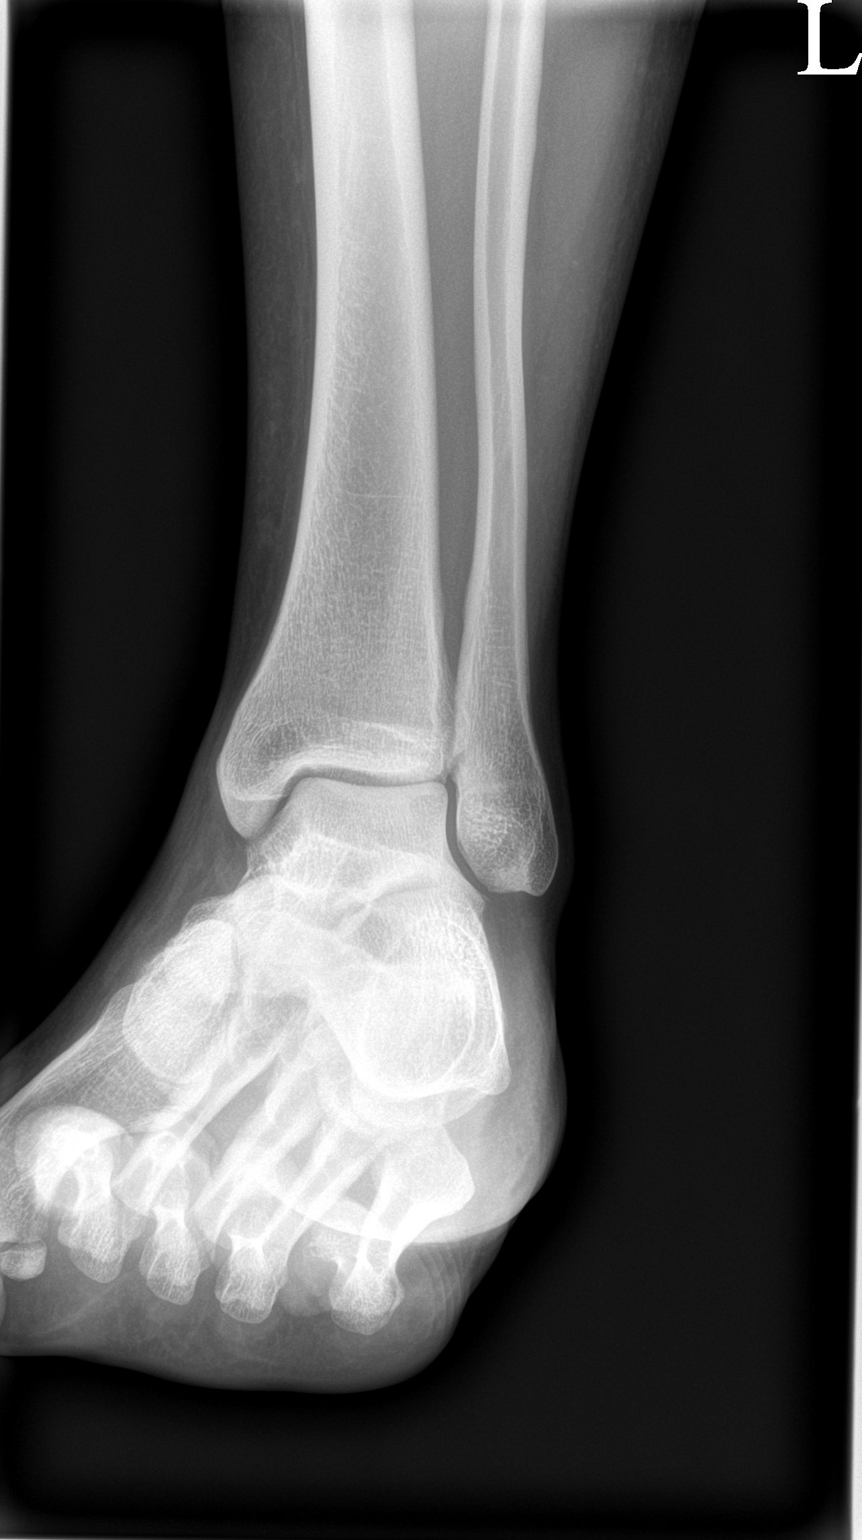

[ankle lat]
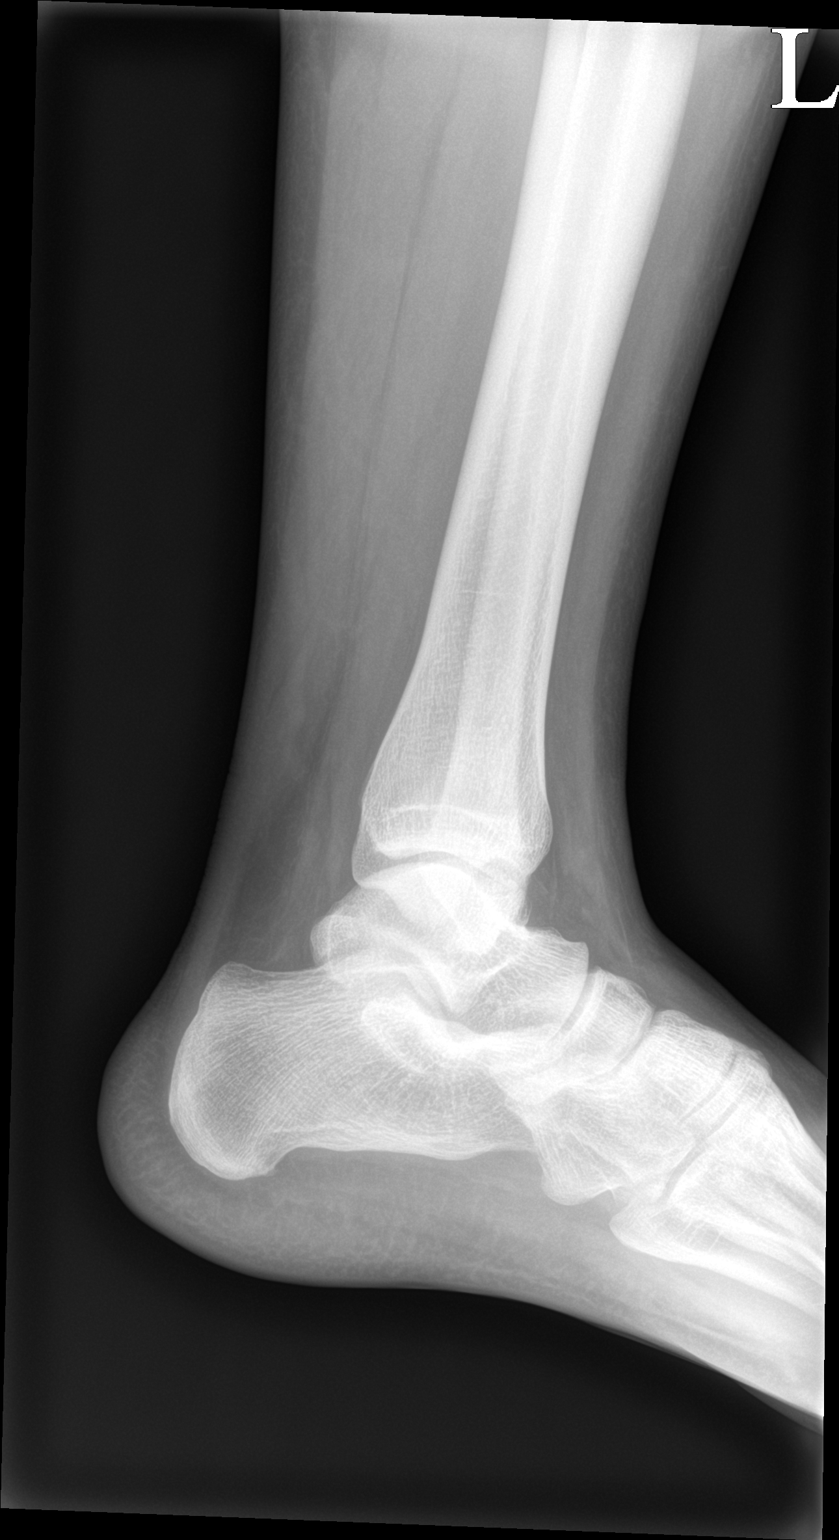

[3 of 3 positions shown; findings below may reference images not displayed]

FINDINGS: There is no evidence of fracture, dislocation, or joint effusion.
There is no evidence of arthropathy or other focal bone abnormality.
Soft tissues are unremarkable.
IMPRESSION: Negative.

## 2022-03-16 ENCOUNTER — Other Ambulatory Visit: Payer: Self-pay | Admitting: Internal Medicine

## 2022-03-16 DIAGNOSIS — K580 Irritable bowel syndrome with diarrhea: Secondary | ICD-10-CM

## 2022-04-06 NOTE — Progress Notes (Unsigned)
   I, Philbert Riser, LAT, ATC acting as a scribe for Clementeen Graham, MD.  Kary Colaizzi is a 41 y.o. male who presents to [facility] at [facility] today for bilateral foot pain. Pt was last seen by Dr. Denyse Amass on 09/16/21 for L plantar fascitis and was advised to cont CAM walker boot and to proceed to MRI. Pt called the office on 10/07/21 reporting his L foot was feeling better and that he did not want to proceed to MRI. Today, pt reports ? Pt locates pain to the medial aspect of the R heel and on the lateral aspect of the L heel.  Swelling: no Aggravates: worse first thing in the mornings Treatments tried: massage the area  Dx imaging: 02/04/21 L os calcis and L ankle XR   Pertinent review of systems: No fevers or chills  Relevant historical information: Hypertension.  Depression.   Exam:  BP 122/78   Pulse 73   Ht 5\' 10"  (1.778 m)   Wt 230 lb (104.3 kg)   SpO2 98%   BMI 33.00 kg/m  General: Well Developed, well nourished, and in no acute distress.   MSK: Right foot normal appearing Tender palpation plantar medial calcaneus.  Normal foot and ankle motion.  Intact strength.  Left foot: Normal-appearing. Tender palpation plantar lateral calcaneus.  Normal foot and ankle motion.  Intact strength.      Assessment and Plan: 42 y.o. male with bilateral calcaneal pain thought to be due to plantar fasciitis.  His left foot pain is more lateral which is more atypical.  I think it is possible he had a partial plantar fascia rupture in June which could cause some more lateral pain now.  We discussed options.  Plan for trial of more conservative management including night splints continued heel cushioning and home exercise program.  Additionally refilled his meloxicam.  Will check back in about a month and if not better consider repeat injection or even MRI at that time.   PDMP not reviewed this encounter. No orders of the defined types were placed in this encounter.  Meds  ordered this encounter  Medications   meloxicam (MOBIC) 15 MG tablet    Sig: TAKE 1 TABLET(15 MG) BY MOUTH DAILY prn pain    Dispense:  90 tablet    Refill:  1     Discussed warning signs or symptoms. Please see discharge instructions. Patient expresses understanding.   The above documentation has been reviewed and is accurate and complete July, M.D.

## 2022-04-07 ENCOUNTER — Ambulatory Visit: Payer: Self-pay

## 2022-04-07 ENCOUNTER — Ambulatory Visit (INDEPENDENT_AMBULATORY_CARE_PROVIDER_SITE_OTHER): Payer: Medicare Other | Admitting: Family Medicine

## 2022-04-07 VITALS — BP 122/78 | HR 73 | Ht 70.0 in | Wt 230.0 lb

## 2022-04-07 DIAGNOSIS — M722 Plantar fascial fibromatosis: Secondary | ICD-10-CM

## 2022-04-07 MED ORDER — MELOXICAM 15 MG PO TABS: ORAL_TABLET | ORAL | 1 refills | Status: DC

## 2022-04-07 NOTE — Patient Instructions (Addendum)
Thank you for coming in today.   Try night splints for plantar fasciitis.  You can buy them online.   Continue the home exercises.   Take meloxicam daily for severe pain.   Recheck in about 1 month especially if not better. We can do injections.

## 2022-05-16 NOTE — Progress Notes (Unsigned)
   I, Peterson Lombard, LAT, ATC acting as a scribe for Lynne Leader, MD.  Derrick Benitez is a 42 y.o. male who presents to [facility] at [facility] today for 1 month follow-up bilateral foot pain thought to be due to plantar fasciitis.  Patient was last seen by Dr. Georgina Snell on 04/07/2022 and was advised to use night splints, heel cushioning and HEP.  His rx for meloxicam was also refilled. Today, pt reports  Dx imaging: 02/04/21 L os calcis and L ankle XR    Pertinent review of systems: ***  Relevant historical information: ***   Exam:  There were no vitals taken for this visit. General: Well Developed, well nourished, and in no acute distress.   MSK: ***    Lab and Radiology Results No results found for this or any previous visit (from the past 72 hour(s)). No results found.     Assessment and Plan: 42 y.o. male with ***   PDMP not reviewed this encounter. No orders of the defined types were placed in this encounter.  No orders of the defined types were placed in this encounter.    Discussed warning signs or symptoms. Please see discharge instructions. Patient expresses understanding.   ***

## 2022-05-17 ENCOUNTER — Ambulatory Visit: Payer: Self-pay

## 2022-05-17 ENCOUNTER — Ambulatory Visit (INDEPENDENT_AMBULATORY_CARE_PROVIDER_SITE_OTHER): Payer: Medicare HMO

## 2022-05-17 ENCOUNTER — Ambulatory Visit (INDEPENDENT_AMBULATORY_CARE_PROVIDER_SITE_OTHER): Payer: Medicare HMO | Admitting: Family Medicine

## 2022-05-17 ENCOUNTER — Encounter: Payer: Self-pay | Admitting: Internal Medicine

## 2022-05-17 ENCOUNTER — Ambulatory Visit (INDEPENDENT_AMBULATORY_CARE_PROVIDER_SITE_OTHER): Payer: Medicare HMO | Admitting: Internal Medicine

## 2022-05-17 ENCOUNTER — Telehealth: Payer: Self-pay | Admitting: Internal Medicine

## 2022-05-17 VITALS — BP 150/86 | HR 70 | Ht 70.0 in | Wt 230.0 lb

## 2022-05-17 VITALS — BP 110/78 | HR 65 | Temp 98.0°F | Ht 70.0 in | Wt 230.0 lb

## 2022-05-17 DIAGNOSIS — K580 Irritable bowel syndrome with diarrhea: Secondary | ICD-10-CM | POA: Diagnosis not present

## 2022-05-17 DIAGNOSIS — N529 Male erectile dysfunction, unspecified: Secondary | ICD-10-CM | POA: Diagnosis not present

## 2022-05-17 DIAGNOSIS — E559 Vitamin D deficiency, unspecified: Secondary | ICD-10-CM | POA: Diagnosis not present

## 2022-05-17 DIAGNOSIS — R5383 Other fatigue: Secondary | ICD-10-CM | POA: Insufficient documentation

## 2022-05-17 DIAGNOSIS — R739 Hyperglycemia, unspecified: Secondary | ICD-10-CM | POA: Diagnosis not present

## 2022-05-17 DIAGNOSIS — M722 Plantar fascial fibromatosis: Secondary | ICD-10-CM

## 2022-05-17 DIAGNOSIS — M7732 Calcaneal spur, left foot: Secondary | ICD-10-CM | POA: Diagnosis not present

## 2022-05-17 LAB — COMPREHENSIVE METABOLIC PANEL
ALT: 64 U/L — ABNORMAL HIGH (ref 0–53)
AST: 31 U/L (ref 0–37)
Albumin: 4.5 g/dL (ref 3.5–5.2)
Alkaline Phosphatase: 68 U/L (ref 39–117)
BUN: 18 mg/dL (ref 6–23)
CO2: 26 mEq/L (ref 19–32)
Calcium: 8.9 mg/dL (ref 8.4–10.5)
Chloride: 103 mEq/L (ref 96–112)
Creatinine, Ser: 0.95 mg/dL (ref 0.40–1.50)
GFR: 99.2 mL/min (ref >60.00)
Glucose, Bld: 142 mg/dL — ABNORMAL HIGH (ref 70–99)
Potassium: 3.8 mEq/L (ref 3.5–5.1)
Sodium: 139 mEq/L (ref 135–145)
Total Bilirubin: 0.7 mg/dL (ref 0.2–1.2)
Total Protein: 7.9 g/dL (ref 6.0–8.3)

## 2022-05-17 LAB — CBC WITH DIFFERENTIAL/PLATELET
Basophils Absolute: 0.1 10*3/uL (ref 0.0–0.1)
Basophils Relative: 1.5 % (ref 0.0–3.0)
Eosinophils Absolute: 0.1 10*3/uL (ref 0.0–0.7)
Eosinophils Relative: 1.6 % (ref 0.0–5.0)
HCT: 46.3 % (ref 39.0–52.0)
Hemoglobin: 15.9 g/dL (ref 13.0–17.0)
Lymphocytes Relative: 33.1 % (ref 12.0–46.0)
Lymphs Abs: 2.3 10*3/uL (ref 0.7–4.0)
MCHC: 34.3 g/dL (ref 30.0–36.0)
MCV: 88.6 fl (ref 78.0–100.0)
Monocytes Absolute: 0.7 10*3/uL (ref 0.1–1.0)
Monocytes Relative: 10.3 % (ref 3.0–12.0)
Neutro Abs: 3.7 10*3/uL (ref 1.4–7.7)
Neutrophils Relative %: 53.5 % (ref 43.0–77.0)
Platelets: 266 10*3/uL (ref 150.0–400.0)
RBC: 5.23 Mil/uL (ref 4.22–5.81)
RDW: 13.5 % (ref 11.5–15.5)
WBC: 7 10*3/uL (ref 4.0–10.5)

## 2022-05-17 LAB — TESTOSTERONE: Testosterone: 349.61 ng/dL (ref 300.00–890.00)

## 2022-05-17 LAB — URINALYSIS
Bilirubin Urine: NEGATIVE
Hgb urine dipstick: NEGATIVE
Ketones, ur: NEGATIVE
Leukocytes,Ua: NEGATIVE
Nitrite: NEGATIVE
Specific Gravity, Urine: >=1.03 — AB (ref 1.000–1.030)
Total Protein, Urine: NEGATIVE
Urine Glucose: NEGATIVE
Urobilinogen, UA: 0.2 (ref 0.0–1.0)
pH: 6 (ref 5.0–8.0)

## 2022-05-17 LAB — VITAMIN D 25 HYDROXY (VIT D DEFICIENCY, FRACTURES): VITD: 16.85 ng/mL — ABNORMAL LOW (ref 30.00–100.00)

## 2022-05-17 LAB — HEMOGLOBIN A1C: Hgb A1c MFr Bld: 6.8 % — ABNORMAL HIGH (ref 4.6–6.5)

## 2022-05-17 LAB — TSH: TSH: 1.27 u[IU]/mL (ref 0.35–5.50)

## 2022-05-17 LAB — T4, FREE: Free T4: 0.96 ng/dL (ref 0.60–1.60)

## 2022-05-17 LAB — VITAMIN B12: Vitamin B-12: 375 pg/mL (ref 211–911)

## 2022-05-17 MED ORDER — SILDENAFIL CITRATE 100 MG PO TABS: 50.0000 mg | ORAL_TABLET | Freq: Every day | ORAL | 1 refills | Status: AC | PRN

## 2022-05-17 NOTE — Assessment & Plan Note (Signed)
Check A1c. 

## 2022-05-17 NOTE — Assessment & Plan Note (Signed)
?  etiology  ED during masturbation x 2 weeks (new) Check testosterone and other labs Start Viagra prn

## 2022-05-17 NOTE — Assessment & Plan Note (Signed)
On Viberzi

## 2022-05-17 NOTE — Patient Instructions (Signed)
Thank you for coming in today.   You should hear from MRI scheduling within 1 week. If you do not hear please let me know.    Please get an Xray today before you leave   Return after the MRI results are back.

## 2022-05-17 NOTE — Progress Notes (Signed)
Subjective:  Patient ID: Derrick Benitez, male    DOB: Sep 01, 1980  Age: 42 y.o. MRN: [id number]  CC: GI Problem, Erectile Dysfunction, and Fatigue   HPI Derrick Benitez presents for ED during masturbation x 2 weeks (new) C/o fatigue x 2 weeks F/u IBS-d He is here w/Kathy, his interpreter   Outpatient Medications Prior to Visit  Medication Sig Dispense Refill   cyclobenzaprine (FLEXERIL) 5 MG tablet Take 1 tablet (5 mg total) by mouth at bedtime as needed for muscle spasms. 30 tablet 0   Eluxadoline (VIBERZI) 75 MG TABS TAKE 1 TABLET BY MOUTH TWICE DAILY AS NEEDED 180 tablet 0   emtricitabine-tenofovir AF (DESCOVY) 200-25 MG tablet Take 1 tablet by mouth daily. 90 tablet 0   hydrOXYzine (ATARAX) 25 MG tablet Take 25 mg by mouth every 6 (six) hours as needed.     pioglitazone (ACTOS) 15 MG tablet TAKE 1 TABLET(15 MG) BY MOUTH DAILY 90 tablet 1   vortioxetine HBr (TRINTELLIX) 10 MG TABS tablet Take 1 tablet (10 mg total) by mouth daily. 90 tablet 0   No facility-administered medications prior to visit.    ROS: Review of Systems  Constitutional:  Positive for fatigue. Negative for appetite change and unexpected weight change.  HENT:  Negative for congestion, nosebleeds, sneezing, sore throat and trouble swallowing.   Eyes:  Negative for itching and visual disturbance.  Respiratory:  Negative for cough.   Cardiovascular:  Negative for chest pain, palpitations and leg swelling.  Gastrointestinal:  Negative for abdominal distention, blood in stool, diarrhea and nausea.  Genitourinary:  Negative for frequency and hematuria.  Musculoskeletal:  Negative for back pain, gait problem, joint swelling and neck pain.  Skin:  Negative for rash.  Neurological:  Negative for dizziness, tremors, speech difficulty and weakness.  Psychiatric/Behavioral:  Negative for agitation, dysphoric mood and sleep disturbance. The patient is not nervous/anxious.     Objective:  BP 110/78 (BP Location: Left Arm,  Patient Position: Sitting, Cuff Size: Normal)   Pulse 65   Temp 98 F (36.7 C) (Oral)   Ht 5\' 10"  (1.778 m)   Wt 230 lb (104.3 kg)   SpO2 95%   BMI 33.00 kg/m   BP Readings from Last 3 Encounters:  05/17/22 110/78  05/17/22 (!) 150/86  04/07/22 122/78    Wt Readings from Last 3 Encounters:  05/17/22 230 lb (104.3 kg)  05/17/22 230 lb (104.3 kg)  04/07/22 230 lb (104.3 kg)    Physical Exam Constitutional:      General: He is not in acute distress.    Appearance: He is well-developed. He is obese.     Comments: NAD  Eyes:     Conjunctiva/sclera: Conjunctivae normal.     Pupils: Pupils are equal, round, and reactive to light.  Neck:     Thyroid: No thyromegaly.     Vascular: No JVD.  Cardiovascular:     Rate and Rhythm: Normal rate and regular rhythm.     Heart sounds: Normal heart sounds. No murmur heard.    No friction rub. No gallop.  Pulmonary:     Effort: Pulmonary effort is normal. No respiratory distress.     Breath sounds: Normal breath sounds. No wheezing or rales.  Chest:     Chest wall: No tenderness.  Abdominal:     General: Bowel sounds are normal. There is no distension.     Palpations: Abdomen is soft. There is no mass.     Tenderness: There is no  abdominal tenderness. There is no guarding or rebound.  Genitourinary:    Penis: Normal.      Testes: Normal.  Musculoskeletal:        General: No tenderness. Normal range of motion.     Cervical back: Normal range of motion.  Lymphadenopathy:     Cervical: No cervical adenopathy.  Skin:    General: Skin is warm and dry.     Findings: No rash.  Neurological:     Mental Status: He is alert and oriented to person, place, and time.     Cranial Nerves: No cranial nerve deficit.     Motor: No abnormal muscle tone.     Coordination: Coordination normal.     Gait: Gait normal.     Deep Tendon Reflexes: Reflexes are normal and symmetric.  Psychiatric:        Behavior: Behavior normal.        Thought  Content: Thought content normal.        Judgment: Judgment normal.     Lab Results  Component Value Date   WBC 8.9 09/16/2021   HGB 16.2 09/16/2021   HCT 47.9 09/16/2021   PLT 294.0 09/16/2021   GLUCOSE 92 09/16/2021   CHOL 143 09/16/2021   TRIG 202.0 (H) 09/16/2021   HDL 36.40 (L) 09/16/2021   LDLDIRECT 91.0 09/16/2021   ALT 45 04/21/2020   AST 32 04/21/2020   NA 140 09/16/2021   K 3.9 09/16/2021   CL 102 09/16/2021   CREATININE 1.14 09/16/2021   BUN 19 09/16/2021   CO2 29 09/16/2021   TSH 1.09 09/16/2021   HGBA1C 6.4 09/16/2021    MR CERVICAL SPINE WO CONTRAST  Result Date: 06/10/2020  [facility] [address], [place], [place] [address] [phone] NEUROIMAGING REPORT STUDY DATE: 06/10/2020 PATIENT NAME: Derrick Benitez DOB: 03-Mar-1981 MRN: [id number] EXAM: MRI of the cervical spine ORDERING CLINICIAN: Richard A. Sater, MD. PhD CLINICAL HISTORY: 42 year old man with an abnormal brain MRI, dysesthesias and neck pain COMPARISON FILMS: None TECHNIQUE: MRI of the cervical spine was obtained utilizing 3 mm sagittal slices from the posterior fossa down to the T3-4 level with T1, T2 and inversion recovery views. In addition 4 mm axial slices from X4-4 down to T1-2 level were included with T2 and gradient echo views. CONTRAST: None IMAGING SITE: [facility], [place], [place], [place] FINDINGS: :  On sagittal images, the spine is imaged from above the cervicomedullary junction to T2.   Visible brain appears normal.  Paravertebral soft tissue appears normal.  The spinal cord is of normal caliber and signal.    Minimal reversal of the cervical curvature..  There is no spondylolisthesis.   The vertebral bodies have normal signal.  The discs and interspaces were further evaluated on axial views from C2 to T1 as follows: C2 - C3:  The disc and interspace appear normal. C3 - C4: There is minimal disc bulging.  There is no foraminal narrowing or spinal stenosis.  No  nerve root compression.  C4 - C5:  There is minimal disc bulging.  There is no foraminal narrowing or spinal stenosis.  No nerve root compression.  C5 - C6:  The disc and interspace appear normal. C6 - C7:  There is minimal disc bulging.  There is no foraminal narrowing or spinal stenosis.  No nerve root compression.  C7 - T1:  The disc and interspace appear normal.   This MRI of the cervical spine without contrast shows  the following: 1.   The spinal cord appears normal.  There is no evidence of demyelination. 2.   Minimal disc degenerative changes at C3-C4 and C4-C5 and C6-C7 that does not cause spinal stenosis or nerve root compression. INTERPRETING PHYSICIAN: Richard A. Felecia Shelling, MD, PhD, FAAN Certified in  Neuroimaging by [employer]    Assessment & Plan:   Problem List Items Addressed This Visit       Digestive   Irritable bowel syndrome with diarrhea    On Viberzi      Relevant Orders   Testosterone   Hemoglobin A1c   Comprehensive metabolic panel   T4, free   Vitamin B12   VITAMIN D 25 Hydroxy (Vit-D Deficiency, Fractures)   Urinalysis   TSH   CBC with Differential/Platelet     Other   Chronic hyperglycemia    Check A1c      Erectile dysfunction - Primary    ?etiology  ED during masturbation x 2 weeks (new) Check testosterone and other labs Start Viagra prn      Relevant Orders   Testosterone   Hemoglobin A1c   Comprehensive metabolic panel   T4, free   Vitamin B12   VITAMIN D 25 Hydroxy (Vit-D Deficiency, Fractures)   Urinalysis   TSH   CBC with Differential/Platelet   Other fatigue    ?etiology New Check testosterone and other labs       Relevant Orders   Testosterone   Hemoglobin A1c   Comprehensive metabolic panel   T4, free   Vitamin B12   VITAMIN D 25 Hydroxy (Vit-D Deficiency, Fractures)   Urinalysis   TSH   CBC with Differential/Platelet   Other Visit Diagnoses     Hyperglycemia       Relevant Orders   Hemoglobin  A1c         Meds ordered this encounter  Medications   sildenafil (VIAGRA) 100 MG tablet    Sig: Take 0.5-1 tablets (50-100 mg total) by mouth daily as needed for erectile dysfunction.    Dispense:  12 tablet    Refill:  1      Follow-up: Return in about 6 weeks (around 06/28/2022) for a follow-up visit.  Walker Kehr, MD

## 2022-05-17 NOTE — Assessment & Plan Note (Signed)
?  etiology New Check testosterone and other labs

## 2022-05-17 NOTE — Progress Notes (Signed)
Left heel x-ray looks okay.  You have a tiny heel spur otherwise no abnormalities.  No changes since 2022.

## 2022-05-17 NOTE — Telephone Encounter (Signed)
Patient states that sildinifil is too expensive. He wants to know if there is a cheaper alternative.  If so please send it to [facility] on [address] in [place], [place].  Please call patient and let him know.[phone]

## 2022-05-22 DIAGNOSIS — E559 Vitamin D deficiency, unspecified: Secondary | ICD-10-CM | POA: Insufficient documentation

## 2022-05-22 MED ORDER — VITAMIN D (ERGOCALCIFEROL) 1.25 MG (50000 UNIT) PO CAPS: 50000.0000 [IU] | ORAL_CAPSULE | ORAL | 0 refills | Status: AC

## 2022-05-22 MED ORDER — VITAMIN D3 50 MCG (2000 UT) PO CAPS: 2000.0000 [IU] | ORAL_CAPSULE | Freq: Every day | ORAL | 3 refills | Status: AC

## 2022-05-22 NOTE — Assessment & Plan Note (Signed)
Start vitamin D prescription, followed by over-the-counter 2000 units daily

## 2022-05-22 NOTE — Addendum Note (Signed)
Addended by: Cassandria Anger on: 05/22/2022 08:44 PM   Modules accepted: Orders

## 2022-06-28 ENCOUNTER — Encounter: Payer: Self-pay | Admitting: Internal Medicine

## 2022-06-28 ENCOUNTER — Ambulatory Visit (INDEPENDENT_AMBULATORY_CARE_PROVIDER_SITE_OTHER): Payer: Medicare HMO | Admitting: Internal Medicine

## 2022-06-28 VITALS — BP 132/82 | HR 69 | Temp 98.1°F | Resp 16 | Ht 70.0 in | Wt 226.0 lb

## 2022-06-28 DIAGNOSIS — E785 Hyperlipidemia, unspecified: Secondary | ICD-10-CM

## 2022-06-28 DIAGNOSIS — E119 Type 2 diabetes mellitus without complications: Secondary | ICD-10-CM

## 2022-06-28 DIAGNOSIS — Z1159 Encounter for screening for other viral diseases: Secondary | ICD-10-CM

## 2022-06-28 DIAGNOSIS — R69 Illness, unspecified: Secondary | ICD-10-CM | POA: Diagnosis not present

## 2022-06-28 DIAGNOSIS — Z7252 High risk homosexual behavior: Secondary | ICD-10-CM | POA: Diagnosis not present

## 2022-06-28 DIAGNOSIS — A510 Primary genital syphilis: Secondary | ICD-10-CM

## 2022-06-28 DIAGNOSIS — F322 Major depressive disorder, single episode, severe without psychotic features: Secondary | ICD-10-CM

## 2022-06-28 DIAGNOSIS — I10 Essential (primary) hypertension: Secondary | ICD-10-CM

## 2022-06-28 DIAGNOSIS — Z23 Encounter for immunization: Secondary | ICD-10-CM | POA: Diagnosis not present

## 2022-06-28 LAB — URINALYSIS, ROUTINE W REFLEX MICROSCOPIC
Bilirubin Urine: NEGATIVE
Hgb urine dipstick: NEGATIVE
Ketones, ur: NEGATIVE
Leukocytes,Ua: NEGATIVE
Nitrite: NEGATIVE
RBC / HPF: NONE SEEN
Specific Gravity, Urine: >=1.03 — AB (ref 1.000–1.030)
Total Protein, Urine: NEGATIVE
Urine Glucose: NEGATIVE
Urobilinogen, UA: 0.2 (ref 0.0–1.0)
pH: 6 (ref 5.0–8.0)

## 2022-06-28 LAB — LIPID PANEL
Cholesterol: 150 mg/dL (ref 0–200)
HDL: 39.3 mg/dL
NonHDL: 110.37
Total CHOL/HDL Ratio: 4
Triglycerides: 218 mg/dL — ABNORMAL HIGH (ref 0.0–149.0)
VLDL: 43.6 mg/dL — ABNORMAL HIGH (ref 0.0–40.0)

## 2022-06-28 LAB — LDL CHOLESTEROL, DIRECT: Direct LDL: 92 mg/dL

## 2022-06-28 LAB — MICROALBUMIN / CREATININE URINE RATIO
Creatinine,U: 176 mg/dL
Microalb Creat Ratio: 0.6 mg/g (ref 0.0–30.0)
Microalb, Ur: 1.1 mg/dL (ref 0.0–1.9)

## 2022-06-28 MED ORDER — VORTIOXETINE HBR 10 MG PO TABS: 10.0000 mg | ORAL_TABLET | Freq: Every day | ORAL | 0 refills | Status: AC

## 2022-06-28 NOTE — Progress Notes (Unsigned)
Subjective:  Patient ID: Derrick Benitez, male    DOB: 02-15-1981  Age: 42 y.o. MRN: [id number]  CC: Hypertension and Diabetes   HPI Cypress Tinnes presents for f/up ---  History is obtained using a [place] interpreter. Since I last saw him he has been diagnosed with DM2.  Outpatient Medications Prior to Visit  Medication Sig Dispense Refill   Cholecalciferol (VITAMIN D3) 50 MCG (2000 UT) capsule Take 1 capsule (2,000 Units total) by mouth daily. 100 capsule 3   cyclobenzaprine (FLEXERIL) 5 MG tablet Take 1 tablet (5 mg total) by mouth at bedtime as needed for muscle spasms. 30 tablet 0   Eluxadoline (VIBERZI) 75 MG TABS TAKE 1 TABLET BY MOUTH TWICE DAILY AS NEEDED 180 tablet 0   emtricitabine-tenofovir AF (DESCOVY) 200-25 MG tablet Take 1 tablet by mouth daily. 90 tablet 0   hydrOXYzine (ATARAX) 25 MG tablet Take 25 mg by mouth every 6 (six) hours as needed.     sildenafil (VIAGRA) 100 MG tablet Take 0.5-1 tablets (50-100 mg total) by mouth daily as needed for erectile dysfunction. 12 tablet 1   Vitamin D, Ergocalciferol, (DRISDOL) 1.25 MG (50000 UNIT) CAPS capsule Take 1 capsule (50,000 Units total) by mouth every 7 (seven) days. 8 capsule 0   pioglitazone (ACTOS) 15 MG tablet TAKE 1 TABLET(15 MG) BY MOUTH DAILY 90 tablet 1   vortioxetine HBr (TRINTELLIX) 10 MG TABS tablet Take 1 tablet (10 mg total) by mouth daily. 90 tablet 0   No facility-administered medications prior to visit.    ROS Review of Systems  Constitutional: Negative.  Negative for chills, fatigue and fever.  HENT: Negative.  Negative for postnasal drip, rhinorrhea, sore throat and trouble swallowing.   Eyes: Negative.   Respiratory:  Negative for cough, chest tightness, shortness of breath and wheezing.   Cardiovascular:  Negative for chest pain, palpitations and leg swelling.  Gastrointestinal:  Negative for abdominal pain.  Endocrine: Negative.   Genitourinary:  Negative for difficulty urinating, dysuria, genital sores,  penile discharge, penile swelling, scrotal swelling and testicular pain.  Musculoskeletal:  Negative for arthralgias, myalgias and neck pain.  Skin: Negative.  Negative for color change and rash.  Allergic/Immunologic: Negative.   Neurological: Negative.  Negative for dizziness and weakness.  Hematological:  Negative for adenopathy. Does not bruise/bleed easily.  Psychiatric/Behavioral: Negative.      Objective:  BP 132/82 (BP Location: Left Arm, Patient Position: Sitting, Cuff Size: Large)   Pulse 69   Temp 98.1 F (36.7 C) (Oral)   Resp 16   Ht 5\' 10"  (1.778 m)   Wt 226 lb (102.5 kg)   SpO2 97%   BMI 32.43 kg/m   BP Readings from Last 3 Encounters:  06/28/22 132/82  05/17/22 110/78  05/17/22 (!) 150/86    Wt Readings from Last 3 Encounters:  06/28/22 226 lb (102.5 kg)  05/17/22 230 lb (104.3 kg)  05/17/22 230 lb (104.3 kg)    Physical Exam Vitals reviewed.  Constitutional:      Appearance: He is obese.  HENT:     Nose: Nose normal.     Mouth/Throat:     Mouth: Mucous membranes are moist.  Eyes:     General: No scleral icterus.    Conjunctiva/sclera: Conjunctivae normal.  Cardiovascular:     Rate and Rhythm: Normal rate and regular rhythm.     Heart sounds: No murmur heard. Pulmonary:     Effort: Pulmonary effort is normal.     Breath sounds:  No stridor. No wheezing, rhonchi or rales.  Abdominal:     General: Abdomen is protuberant. Bowel sounds are normal. There is no distension.     Palpations: Abdomen is soft. There is no hepatomegaly, splenomegaly or mass.     Tenderness: There is no abdominal tenderness. There is no guarding.  Musculoskeletal:        General: Normal range of motion.     Cervical back: Neck supple.     Right lower leg: No edema.     Left lower leg: No edema.  Lymphadenopathy:     Cervical: No cervical adenopathy.  Skin:    General: Skin is warm and dry.  Neurological:     General: No focal deficit present.     Mental Status: He is  alert. Mental status is at baseline.  Psychiatric:        Mood and Affect: Mood normal.        Behavior: Behavior normal.     Lab Results  Component Value Date   WBC 7.0 05/17/2022   HGB 15.9 05/17/2022   HCT 46.3 05/17/2022   PLT 266.0 05/17/2022   GLUCOSE 142 (H) 05/17/2022   CHOL 150 06/28/2022   TRIG 218.0 (H) 06/28/2022   HDL 39.30 06/28/2022   LDLDIRECT 92.0 06/28/2022   ALT 64 (H) 05/17/2022   AST 31 05/17/2022   NA 139 05/17/2022   K 3.8 05/17/2022   CL 103 05/17/2022   CREATININE 0.95 05/17/2022   BUN 18 05/17/2022   CO2 26 05/17/2022   TSH 1.27 05/17/2022   HGBA1C 6.8 (H) 05/17/2022   MICROALBUR 1.1 06/28/2022    MR CERVICAL SPINE WO CONTRAST  Result Date: 06/10/2020  [facility] [address], [place], [place] [address] [phone] NEUROIMAGING REPORT STUDY DATE: 06/10/2020 PATIENT NAME: Derrick Benitez DOB: [birth year] MRN: [id number] EXAM: MRI of the cervical spine ORDERING CLINICIAN: Richard A. Sater, MD. PhD CLINICAL HISTORY: 42 year old man with an abnormal brain MRI, dysesthesias and neck pain COMPARISON FILMS: None TECHNIQUE: MRI of the cervical spine was obtained utilizing 3 mm sagittal slices from the posterior fossa down to the T3-4 level with T1, T2 and inversion recovery views. In addition 4 mm axial slices from [id number] down to T1-2 level were included with T2 and gradient echo views. CONTRAST: None IMAGING SITE: [facility], [place], [place], [place] FINDINGS: :  On sagittal images, the spine is imaged from above the cervicomedullary junction to T2.   Visible brain appears normal.  Paravertebral soft tissue appears normal.  The spinal cord is of normal caliber and signal.    Minimal reversal of the cervical curvature..  There is no spondylolisthesis.   The vertebral bodies have normal signal.  The discs and interspaces were further evaluated on axial views from C2 to T1 as follows: C2 - C3:  The disc and interspace appear normal. C3  - C4: There is minimal disc bulging.  There is no foraminal narrowing or spinal stenosis.  No nerve root compression.  C4 - C5:  There is minimal disc bulging.  There is no foraminal narrowing or spinal stenosis.  No nerve root compression.  C5 - C6:  The disc and interspace appear normal. C6 - C7:  There is minimal disc bulging.  There is no foraminal narrowing or spinal stenosis.  No nerve root compression.  C7 - T1:  The disc and interspace appear normal.   This MRI of the cervical spine without contrast shows the following:  1.   The spinal cord appears normal.  There is no evidence of demyelination. 2.   Minimal disc degenerative changes at C3-C4 and C4-C5 and C6-C7 that does not cause spinal stenosis or nerve root compression. INTERPRETING PHYSICIAN: Richard A. Felecia Shelling, MD, PhD, FAAN Certified in  Neuroimaging by [employer]    Assessment & Plan:    Type 2 diabetes mellitus without complication, without long-term current use of insulin ([place])- Will treat with lifestyle modifications. -     Microalbumin / creatinine urine ratio; Future -     Urinalysis, Routine w reflex microscopic; Future -     HM Diabetes Foot Exam -     Ambulatory referral to Ophthalmology -     Amb Referral to Nutrition and Diabetic Education  Hyperlipidemia LDL goal <160- I have asked him to take a statin for CV risk reduction. -     Lipid panel; Future -     Rosuvastatin Calcium; Take 1 tablet (5 mg total) by mouth daily.  Dispense: 90 tablet; Refill: 1  Primary hypertension- His blood pressure is adequately well-controlled. 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 -     RPR; Future -     HIV Antibody (routine testing w rflx); Future -     Hepatitis C antibody; Future  High risk homosexual behavior -     HIV Antibody (routine testing w rflx); Future -     Hepatitis C antibody;  Future -     Hepatitis B surface antigen; Future  Current severe episode of major depressive disorder without psychotic features without prior episode (HCC) -     Vortioxetine HBr; Take 1 tablet (10 mg total) by mouth daily.  Dispense: 90 tablet; Refill: 0  Need for hepatitis C screening test -     Hepatitis C antibody; Future  Need for vaccination -     Pneumococcal conjugate vaccine 20-valent  Other orders -     LDL cholesterol, direct     Follow-up: Return in about 4 months (around 10/28/2022).  Scarlette Calico, MD

## 2022-06-28 NOTE — Patient Instructions (Signed)

## 2022-06-29 ENCOUNTER — Encounter: Payer: Self-pay | Admitting: Internal Medicine

## 2022-06-29 LAB — HEPATITIS C ANTIBODY: Hepatitis C Ab: NONREACTIVE

## 2022-06-29 LAB — HIV ANTIBODY (ROUTINE TESTING W REFLEX): HIV 1&2 Ab, 4th Generation: NONREACTIVE

## 2022-06-29 LAB — RPR: RPR Ser Ql: NONREACTIVE

## 2022-06-29 LAB — HEPATITIS B SURFACE ANTIGEN: Hepatitis B Surface Ag: NONREACTIVE

## 2022-06-29 MED ORDER — ROSUVASTATIN CALCIUM 5 MG PO TABS: 5.0000 mg | ORAL_TABLET | Freq: Every day | ORAL | 1 refills | Status: DC

## 2022-09-12 ENCOUNTER — Encounter: Payer: Self-pay | Admitting: Dietician

## 2022-09-12 ENCOUNTER — Encounter: Payer: Medicare HMO | Attending: Internal Medicine | Admitting: Dietician

## 2022-09-12 VITALS — Ht 70.0 in | Wt 219.0 lb

## 2022-09-12 DIAGNOSIS — E119 Type 2 diabetes mellitus without complications: Secondary | ICD-10-CM | POA: Insufficient documentation

## 2022-09-12 NOTE — Progress Notes (Unsigned)
Diabetes Self-Management Education  Visit Type: First/Initial  Appt. Start Time: 1600 Appt. End Time: 1700  09/13/2022  [name], identified by name and date of birth, is a 42 y.o. male with a diagnosis of Diabetes: Type 2.   ASSESSMENT Patient is here today with a sign language interpretor, Leta Jungling with [employer].  He states that he was evaluated for diabetes after eye doctor noticed diabetes related changes in his eyes. He states that he has been eating a lot of salads and desires to decrease his cholesterol.   He has stopped a lot of junk food.  Instructed patient on Blood glucose monitoring. Meter provided:  Accu-Chek Guide Me Expiration 06/15/2023 Lot [id number] Blood glucose was 182 when patient demonstrated (2 hours after lunch)  History includes: Type 2 diabetes, depression, deaf, IBS-D (since age 36) Medication includes:  Rosuvastatin, viberzi (for IBS and helps his appetite per patient), Vitamin D3 Labs noted to include A1C 6.8% 05/17/2022 increased from 6.4% 09/16/2021. Lipid Panel     Component Value Date/Time   CHOL 150 06/28/2022 1141   TRIG 218.0 (H) 06/28/2022 1141   HDL 39.30 06/28/2022 1141   CHOLHDL 4 06/28/2022 1141   VLDL 43.6 (H) 06/28/2022 1141   LDLDIRECT 92.0 06/28/2022 1141   Weight hx: 70" 219 lbs 09/12/2022 235 lbs lost weight by eating less as MD recommended weight loss due to his stomach.  He states that he gained weight in [place] after taking a medication for depression. 190 lbs UBW prior to medication  Patient lives alone.   He has been in [place] since 2005. 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 Eats [employer] Subs out frequently ([employer]) Does not tolerate dairy and uses soy or almond milk (unsweetened).  Tolerates mozerella.  Walks 3 days per week and some weights at home.  Height 5\' 10"  (1.778 m), weight 219 lb (99.3 kg). Body mass index is 31.42 kg/m.   Diabetes Self-Management Education - 09/12/22 1621       Visit Information    Visit Type First/Initial      Initial Visit   Diabetes Type Type 2    Date Diagnosed 2024    Are you currently following a meal plan? No    Are you taking your medications as prescribed? Not on Medications      Health Coping   How would you rate your overall health? Good      Psychosocial Assessment   Patient Belief/Attitude about Diabetes Other (comment)   not sure   Self-care barriers None    Self-management support Doctor's office    Other persons present Patient    Patient Concerns Nutrition/Meal planning;Healthy Lifestyle    Special Needs None    Preferred Learning Style No preference indicated    Learning Readiness Ready    How often do you need to have someone help you when you read instructions, pamphlets, or other written materials from your doctor or pharmacy? 3 - Sometimes      Pre-Education Assessment   Patient understands the diabetes disease and treatment process. Needs Instruction    Patient understands incorporating nutritional management into lifestyle. Needs Instruction    Patient undertands incorporating physical activity into lifestyle. Needs Instruction    Patient understands using medications safely. Needs Instruction    Patient understands monitoring blood glucose, interpreting and using results Needs Instruction    Patient understands prevention, detection, and treatment of acute complications. Needs Instruction    Patient understands prevention, detection, and treatment  of chronic complications. Needs Instruction    Patient understands how to develop strategies to address psychosocial issues. Needs Instruction    Patient understands how to develop strategies to promote health/change behavior. Needs Instruction      Complications   Last HgB A1C per patient/outside source 6.8 %   05/17/2022   How often do you check your blood sugar? Not recommended by provider    Have you had a dilated eye exam in the past 12 months? Yes    Have you had a dental exam in the  past 12 months? No    Are you checking your feet? No      Dietary Intake   Breakfast skips    Snack (morning) none    Lunch fried egg sandwich on white wheat and occasional sausage OR pot pie   3 pm   Snack (afternoon) none    Dinner [employer]  house - Hamburger on a Bun with cheese, salad with ranch dressing or french dressing    Snack (evening) none    Beverage(s) water, sugar free flavor packet, diet Dr. Reino Kent or diet Coke or Sprite Zero, occasional half and half sweet tea      Activity / Exercise   Activity / Exercise Type Light (walking / raking leaves)    How many days per week do you exercise? 3    How many minutes per day do you exercise? 25    Total minutes per week of exercise 75      Patient Education   Previous Diabetes Education No    Disease Pathophysiology Definition of diabetes, type 1 and 2, and the diagnosis of diabetes    Healthy Eating Role of diet in the treatment of diabetes and the relationship between the three main macronutrients and blood glucose level    Being Active Role of exercise on diabetes management, blood pressure control and cardiac health.    Monitoring Taught/evaluated SMBG meter.    Acute complications Taught prevention, symptoms, and  treatment of hypoglycemia - the 15 rule.;Discussed and identified patients' prevention, symptoms, and treatment of hyperglycemia.    Chronic complications Relationship between chronic complications and blood glucose control    Diabetes Stress and Support Identified and addressed patients feelings and concerns about diabetes;Worked with patient to identify barriers to care and solutions;Role of stress on diabetes      Individualized Goals (developed by patient)   Nutrition General guidelines for healthy choices and portions discussed    Physical Activity Exercise 5-7 days per week;30 minutes per day    Medications take my medication as prescribed    Monitoring  Not Applicable    Problem Solving Eating  Pattern;Addressing barriers to behavior change    Reducing Risk examine blood glucose patterns;do foot checks daily      Post-Education Assessment   Patient understands the diabetes disease and treatment process. Comprehends key points    Patient understands incorporating nutritional management into lifestyle. Needs Review    Patient undertands incorporating physical activity into lifestyle. Comprehends key points    Patient understands using medications safely. Comphrehends key points    Patient understands monitoring blood glucose, interpreting and using results Comprehends key points    Patient understands prevention, detection, and treatment of acute complications. Comprehends key points    Patient understands prevention, detection, and treatment of chronic complications. Comprehends key points    Patient understands how to develop strategies to address psychosocial issues. Comprehends key points    Patient understands how to develop  strategies to promote health/change behavior. Needs Review      Outcomes   Expected Outcomes Demonstrated interest in learning. Expect positive outcomes    Future DMSE 3-4 months    Program Status Not Completed             Individualized Plan for Diabetes Self-Management Training:   Learning Objective:  Patient will have a greater understanding of diabetes self-management. Patient education plan is to attend individual and/or group sessions per assessed needs and concerns.   Plan:   Patient Instructions  Plan:  Aim for 3-4 Carb Choices per meal (45-60 grams) +/- 1 either way  Aim for 0-15 Carbs per snack if hungry  Include protein in moderation with your meals and snacks Consider reading food labels for Total Carbohydrate of foods Consider  increasing your activity level by walking for 30 minutes daily as tolerated Consider checking BG at alternate times per day      Expected Outcomes:  Demonstrated interest in learning. Expect positive  outcomes  Education material provided: ADA - How to Thrive: A Guide for Your Journey with Diabetes, Meal plan card, and Diabetes Resources  If problems or questions, patient to contact team via:  Phone  Future DSME appointment: 3-4 months

## 2022-09-12 NOTE — Patient Instructions (Signed)
Plan:  Aim for 3-4 Carb Choices per meal (45-60 grams) +/- 1 either way  Aim for 0-15 Carbs per snack if hungry  Include protein in moderation with your meals and snacks Consider reading food labels for Total Carbohydrate of foods Consider  increasing your activity level by walking for 30 minutes daily as tolerated Consider checking BG at alternate times per day

## 2022-09-21 ENCOUNTER — Other Ambulatory Visit: Payer: Self-pay | Admitting: Internal Medicine

## 2022-09-21 DIAGNOSIS — E119 Type 2 diabetes mellitus without complications: Secondary | ICD-10-CM

## 2022-09-21 MED ORDER — ACCU-CHEK GUIDE VI STRP: 1.0000 | ORAL_STRIP | Freq: Two times a day (BID) | 2 refills | Status: AC

## 2022-09-21 MED ORDER — ACCU-CHEK SOFTCLIX LANCETS MISC: 1.0000 | Freq: Two times a day (BID) | 2 refills | Status: AC

## 2022-09-21 MED ORDER — ACCU-CHEK GUIDE ME W/DEVICE KIT: 1.0000 | PACK | Freq: Two times a day (BID) | 2 refills | Status: AC

## 2022-10-19 ENCOUNTER — Other Ambulatory Visit: Payer: Self-pay | Admitting: Internal Medicine

## 2022-10-19 DIAGNOSIS — K580 Irritable bowel syndrome with diarrhea: Secondary | ICD-10-CM

## 2022-10-29 ENCOUNTER — Other Ambulatory Visit (HOSPITAL_COMMUNITY): Payer: Self-pay

## 2022-10-29 ENCOUNTER — Telehealth: Payer: Self-pay

## 2022-10-29 NOTE — Telephone Encounter (Signed)
Pharmacy Patient Advocate Encounter   Received notification from CoverMyMeds that prior authorization for Viberzi 75MG  tablets is required/requested.   Insurance verification completed.   The patient is insured through  [employer]  .   Per test claim: PA submitted to [employer] via CoverMyMeds Key/confirmation #/EOC [id number] Status is pending

## 2022-11-01 NOTE — Telephone Encounter (Signed)
Pharmacy Patient Advocate Encounter  Received notification from [facility] Medicare that Prior Authorization for Viberzi 75mg  has been The requested drug is not on your plan's formulary (list of covered drugs). Your Medicare Part D drug plan was asked to cover a drug that is not on the formulary  (this is called a formulary exception). Your prescriber did not provide the detailed information that is required in  order to approve the request. To receive a formulary exception, per the Part D Coverage Determination guidance Section 40.5.2 and 40.5.3, your prescriber must provide information that documents at least one of the following has occurred:  - You have tried the formulary drugs for the treatment of your condition and they did not work for you. OR - The formulary drugs could cause adverse effects. OR - The formulary drugs would be less effective for your condition than the requested drug.Marland Kitchen  PA #/Case ID/Reference #: [id number]   Full Denial letter will be uploaded to media tab.  Appeals contact: Phone: [phone], or Fax: [phone]

## 2022-11-27 DIAGNOSIS — E785 Hyperlipidemia, unspecified: Secondary | ICD-10-CM | POA: Diagnosis not present

## 2022-11-27 DIAGNOSIS — Z008 Encounter for other general examination: Secondary | ICD-10-CM | POA: Diagnosis not present

## 2022-11-27 DIAGNOSIS — N529 Male erectile dysfunction, unspecified: Secondary | ICD-10-CM | POA: Diagnosis not present

## 2022-11-27 DIAGNOSIS — Z6830 Body mass index (BMI) 30.0-30.9, adult: Secondary | ICD-10-CM | POA: Diagnosis not present

## 2022-11-27 DIAGNOSIS — R030 Elevated blood-pressure reading, without diagnosis of hypertension: Secondary | ICD-10-CM | POA: Diagnosis not present

## 2022-11-27 DIAGNOSIS — Z79899 Other long term (current) drug therapy: Secondary | ICD-10-CM | POA: Diagnosis not present

## 2022-11-27 DIAGNOSIS — E669 Obesity, unspecified: Secondary | ICD-10-CM | POA: Diagnosis not present

## 2022-11-27 DIAGNOSIS — D8481 Immunodeficiency due to conditions classified elsewhere: Secondary | ICD-10-CM | POA: Diagnosis not present

## 2022-11-27 DIAGNOSIS — B20 Human immunodeficiency virus [HIV] disease: Secondary | ICD-10-CM | POA: Diagnosis not present

## 2022-11-27 DIAGNOSIS — D84821 Immunodeficiency due to drugs: Secondary | ICD-10-CM | POA: Diagnosis not present

## 2022-11-27 DIAGNOSIS — F325 Major depressive disorder, single episode, in full remission: Secondary | ICD-10-CM | POA: Diagnosis not present

## 2022-11-27 DIAGNOSIS — E119 Type 2 diabetes mellitus without complications: Secondary | ICD-10-CM | POA: Diagnosis not present

## 2022-11-27 DIAGNOSIS — K589 Irritable bowel syndrome without diarrhea: Secondary | ICD-10-CM | POA: Diagnosis not present

## 2022-11-28 ENCOUNTER — Encounter: Payer: Self-pay | Admitting: Dietician

## 2022-11-28 ENCOUNTER — Encounter: Payer: Medicare HMO | Attending: Internal Medicine | Admitting: Dietician

## 2022-11-28 DIAGNOSIS — E119 Type 2 diabetes mellitus without complications: Secondary | ICD-10-CM | POA: Insufficient documentation

## 2022-11-28 NOTE — Progress Notes (Signed)
Diabetes Self-Management Education  Visit Type: Follow-up  Appt. Start Time: 1200 Appt. End Time: 1230  11/28/2022  [name], identified by name and date of birth, is a 42 y.o. male with a diagnosis of Diabetes:  .   ASSESSMENT  Patient is here today with his interpretor from [employer] (sign language). He was last seen by this RD on 09/12/2022.  He stands and walks a lot on this job.  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 Checked his blood glucose 2 days ago (144 after a meal)  History includes: Type 2 diabetes, depression, deaf, IBS-D (since age 54) Medication includes:  Rosuvastatin, viberzi (for IBS and helps his appetite per patient), Vitamin D3 Labs noted to include A1C 6.8% 05/17/2022 increased from 6.4% 09/16/2021.  Vitamin D 16 05/17/2022 Lipid Panel          Component Value Date/Time    CHOL 150 06/28/2022 1141    TRIG 218.0 (H) 06/28/2022 1141    HDL 39.30 06/28/2022 1141    CHOLHDL 4 06/28/2022 1141    VLDL 43.6 (H) 06/28/2022 1141    LDLDIRECT 92.0 06/28/2022 1141    Weight hx: 70" 218 lbs 11/28/2022 219 lbs 09/12/2022 235 lbs lost weight by eating less as MD recommended weight loss due to his stomach.  He states that he gained weight in [place] after taking a medication for depression. 190 lbs UBW prior to medication   Patient lives alone.   He has been in [place] since 2005. He works at [employer] 5 days per week. Eats [place] [employer] Subs out frequently ([employer]) Does not tolerate dairy and uses soy or almond milk (unsweetened).  Tolerates mozerella.  Walks 3 days per week and some weights at home.   Diabetes Self-Management Education - 11/28/22 1208       Visit Information   Visit Type Follow-up      Psychosocial Assessment   Self-care barriers None    Self-management support Doctor's office    Other persons present Patient;Interpreter    Patient Concerns Nutrition/Meal planning;Healthy Lifestyle;Weight Control    Special Needs  Other (comment)   interpretor   Preferred Learning Style Visual    Learning Readiness Ready      Pre-Education Assessment   Patient understands the diabetes disease and treatment process. Needs Review    Patient understands incorporating nutritional management into lifestyle. Needs Review    Patient undertands incorporating physical activity into lifestyle. Needs Review    Patient understands using medications safely. Needs Review    Patient understands monitoring blood glucose, interpreting and using results Needs Review    Patient understands prevention, detection, and treatment of acute complications. Needs Review    Patient understands prevention, detection, and treatment of chronic complications. Needs Review    Patient understands how to develop strategies to address psychosocial issues. Needs Review    Patient understands how to develop strategies to promote health/change behavior. Needs Review      Dietary Intake   Breakfast 1 slice swiss cheese    Lunch skipped    Dinner salad with [place] dressing, 2-3 slices pizza    Snack (evening) chicken and swiss sandwich on Whole Wheat, goldfish crackers    Beverage(s) water, sugar free flavor packet, diet Coke or diet Pepsi      Activity / Exercise   Activity / Exercise Type ADL's   related to work     Patient Education   Previous Diabetes Education Yes (please comment)  09/2022   Healthy Eating Meal options for control of blood glucose level and chronic complications.;Plate Method    Being Active Role of exercise on diabetes management, blood pressure control and cardiac health.    Monitoring Taught/evaluated CGM (comment)    Diabetes Stress and Support Role of stress on diabetes      Individualized Goals (developed by patient)   Nutrition General guidelines for healthy choices and portions discussed    Physical Activity Exercise 5-7 days per week;30 minutes per day    Medications take my medication as prescribed    Monitoring   Test my blood glucose as discussed    Problem Solving Eating Pattern    Reducing Risk examine blood glucose patterns      Patient Self-Evaluation of Goals - Patient rates self as meeting previously set goals (% of time)   Nutrition 50 - 75 % (half of the time)    Physical Activity 50 - 75 % (half of the time)    Medications Not Applicable    Monitoring 25 - 50% (sometimes)    Problem Solving and behavior change strategies  25 - 50% (sometimes)    Reducing Risk (treating acute and chronic complications) 50 - 75 % (half of the time)    Health Coping 50 - 75 % (half of the time)      Post-Education Assessment   Patient understands the diabetes disease and treatment process. Comprehends key points    Patient understands incorporating nutritional management into lifestyle. Needs Review    Patient undertands incorporating physical activity into lifestyle. Comprehends key points    Patient understands using medications safely. Comphrehends key points    Patient understands monitoring blood glucose, interpreting and using results Comprehends key points    Patient understands prevention, detection, and treatment of acute complications. Comprehends key points    Patient understands prevention, detection, and treatment of chronic complications. Comprehends key points    Patient understands how to develop strategies to address psychosocial issues. Comprehends key points    Patient understands how to develop strategies to promote health/change behavior. Needs Review      Outcomes   Expected Outcomes Demonstrated interest in learning. Expect positive outcomes    Future DMSE 3-4 months    Program Status Not Completed      Subsequent Visit   Since your last visit have you experienced any weight changes? No change             Individualized Plan for Diabetes Self-Management Training:   Learning Objective:  Patient will have a greater understanding of diabetes self-management. Patient  education plan is to attend individual and/or group sessions per assessed needs and concerns.   Plan:   Patient Instructions  [employer] job on changing your beverages! Eat  Plan:  Aim for 3-4 Carb Choices per meal (45-60 grams) +/- 1 either way  Aim for 0-15 Carbs per snack if hungry  Include protein in moderation with your meals and snacks Consider reading food labels for Total Carbohydrate of foods Consider  increasing your activity level by walking for 30 minutes daily as tolerated Consider checking BG at alternate times per day       Expected Outcomes:  Demonstrated interest in learning. Expect positive outcomes  Education material provided:   If problems or questions, patient to contact team via:  Phone  Future DSME appointment: 3-4 months

## 2022-11-28 NOTE — Patient Instructions (Signed)
Great job on changing your beverages! Eat  Plan:  Aim for 3-4 Carb Choices per meal (45-60 grams) +/- 1 either way  Aim for 0-15 Carbs per snack if hungry  Include protein in moderation with your meals and snacks Consider reading food labels for Total Carbohydrate of foods Consider  increasing your activity level by walking for 30 minutes daily as tolerated Consider checking BG at alternate times per day

## 2022-12-22 ENCOUNTER — Other Ambulatory Visit: Payer: Self-pay | Admitting: Internal Medicine

## 2022-12-22 DIAGNOSIS — E785 Hyperlipidemia, unspecified: Secondary | ICD-10-CM

## 2023-02-10 ENCOUNTER — Encounter: Payer: Self-pay | Admitting: Dietician

## 2023-02-10 ENCOUNTER — Encounter: Payer: Medicare HMO | Attending: Internal Medicine | Admitting: Dietician

## 2023-02-10 DIAGNOSIS — E119 Type 2 diabetes mellitus without complications: Secondary | ICD-10-CM | POA: Diagnosis not present

## 2023-02-10 NOTE — Patient Instructions (Addendum)
Choose water most often or flavor packets with 0-1 grams total carbohydrate such as  Crystal Light  True Lemon Balance your plate Fruits & Vegetables: Aim to fill half your plate with a variety of fruits and vegetables. They are rich in vitamins, minerals, and fiber, and can help reduce the risk of chronic diseases. Choose a colorful assortment of fruits and vegetables to ensure you get a wide range of nutrients. Grains and Starches: Make at least half of your grain choices whole grains, such as brown rice, whole wheat bread, and oats. Whole grains provide fiber, which aids in digestion and healthy cholesterol levels. Aim for whole forms of starchy vegetables such as potatoes, sweet potatoes, beans, peas, and corn, which are fiber rich and provide many vitamins and minerals.  Protein: Incorporate lean sources of protein, such as poultry, fish, beans, nuts, and seeds, into your meals. Protein is essential for building and repairing tissues, staying full, balancing blood sugar, as well as supporting immune function. Dairy: Include low-fat or fat-free dairy products like milk, yogurt, and cheese in your diet. Dairy foods are excellent sources of calcium and vitamin D, which are crucial for bone health.  Physical Activity: Aim for 60 minutes of physical activity daily. Regular physical activity promotes overall health-including helping to reduce risk for heart disease and diabetes, promoting mental health, and helping [place] sleep better.    Plan:  Aim for 3-4 Carb Choices per meal (45-60 grams) +/- 1 either way  Aim for 0-1 Carbs per snack if hungry  Include protein in moderation with your meals and snacks Consider reading food labels for Total Carbohydrate of foods Consider  increasing your activity level by walk for 30 minutes daily as tolerated Consider checking BG at alternate times per day    Ask your doctor about your A1C and let me know what this is at your next visit.  Blood glucose  goals:  80-130 fasting   100-180 two hours after starting any meal  Generally a rise of 40-60 points after a meal is normal  A1C Goal:   Less than 7%.  Your last A1C was  Lab Results  Component Value Date   HGBA1C 6.8 (H) 05/17/2022

## 2023-02-10 NOTE — Progress Notes (Signed)
[id number]     Diabetes Self-Management Education  Visit Type: Follow-up  Appt. Start Time: 1130 Appt. End Time: 1200  02/17/2023  [name], identified by name and date of birth, is a 42 y.o. male with a diagnosis of Diabetes:  .   ASSESSMENT Patient is here today with his interpretor from [facility] (sign language), Olegario Messier. He was last seen by this RD on 11/28/2022.  He is checking his blood glucose after a meal.  It was 211 last night after pasta. States that he feels like he is always hungry.   History includes: Type 2 diabetes, depression, deaf, IBS-D (since age 48) Medication includes:  Rosuvastatin, viberzi (for IBS and helps his appetite per patient), Vitamin D3 Labs noted to include A1C 6.8% 05/17/2022 increased from 6.4% 09/16/2021.  Vitamin D 16 05/17/2022 Lipid Panel              Component Value Date/Time    CHOL 150 06/28/2022 1141    TRIG 218.0 (H) 06/28/2022 1141    HDL 39.30 06/28/2022 1141    CHOLHDL 4 06/28/2022 1141    VLDL 43.6 (H) 06/28/2022 1141    LDLDIRECT 92.0 06/28/2022 1141    Weight hx: 70" 223 lbs 02/10/2023 218 lbs 11/28/2022 219 lbs 09/12/2022 235 lbs lost weight by eating less as MD recommended weight loss due to his stomach.  He states that he gained weight in [place] after taking a medication for depression. 190 lbs UBW prior to medication   Patient lives alone.   He has been in [place] since 2005. He works at [employer] 5 days per week. Eats [place] [employer]s out frequently ([employer]) Does not tolerate dairy and uses soy or almond milk (unsweetened).  Tolerates mozerella.  Walks 3 days per week and some weights at home.   Diabetes Self-Management Education - 02/17/23 1500       Visit Information   Visit Type Follow-up      Psychosocial Assessment   Self-care barriers None    Self-management support Doctor's office;CDE visits    Other persons present Patient;Interpreter    Patient Concerns Nutrition/Meal planning;Glycemic  Control;Weight Control    Special Needs Other (comment)    Preferred Learning Style Visual    Learning Readiness Ready      Pre-Education Assessment   Patient understands the diabetes disease and treatment process. Needs Review    Patient understands incorporating nutritional management into lifestyle. Needs Review    Patient undertands incorporating physical activity into lifestyle. Needs Review    Patient understands using medications safely. Needs Review    Patient understands monitoring blood glucose, interpreting and using results Needs Review    Patient understands prevention, detection, and treatment of acute complications. Needs Review    Patient understands prevention, detection, and treatment of chronic complications. Needs Review    Patient understands how to develop strategies to address psychosocial issues. Needs Review    Patient understands how to develop strategies to promote health/change behavior. Needs Review      Complications   How often do you check your blood sugar? Not recommended by provider      Dietary Intake   Breakfast skips    Lunch salad with chicken with [place] dressing    Dinner pasta with meat sauce, occasional garlic bread    Beverage(s) water, occasional diet Pepsi, 10 gram carb flavor packet      Activity / Exercise   Activity / Exercise Type ADL's  Patient Education   Previous Diabetes Education Yes (please comment)    Healthy Eating Meal options for control of blood glucose level and chronic complications.    Being Active Role of exercise on diabetes management, blood pressure control and cardiac health.    Diabetes Stress and Support Identified and addressed patients feelings and concerns about diabetes;Worked with patient to identify barriers to care and solutions      Individualized Goals (developed by patient)   Nutrition General guidelines for healthy choices and portions discussed    Physical Activity Exercise 5-7 days per week;30  minutes per day    Medications take my medication as prescribed    Problem Solving Eating Pattern    Reducing Risk do foot checks daily      Patient Self-Evaluation of Goals - Patient rates self as meeting previously set goals (% of time)   Nutrition 50 - 75 % (half of the time)    Physical Activity 50 - 75 % (half of the time)    Medications Not Applicable    Monitoring Not Applicable    Problem Solving and behavior change strategies  25 - 50% (sometimes)    Reducing Risk (treating acute and chronic complications) 50 - 75 % (half of the time)    Health Coping 50 - 75 % (half of the time)      Post-Education Assessment   Patient understands the diabetes disease and treatment process. Comprehends key points    Patient understands incorporating nutritional management into lifestyle. Needs Review    Patient undertands incorporating physical activity into lifestyle. Comprehends key points    Patient understands using medications safely. Comphrehends key points    Patient understands monitoring blood glucose, interpreting and using results Comprehends key points    Patient understands prevention, detection, and treatment of acute complications. Comprehends key points    Patient understands prevention, detection, and treatment of chronic complications. Comprehends key points    Patient understands how to develop strategies to address psychosocial issues. Comprehends key points    Patient understands how to develop strategies to promote health/change behavior. Needs Review      Outcomes   Expected Outcomes Demonstrated interest in learning. Expect positive outcomes    Future DMSE 3-4 months             Individualized Plan for Diabetes Self-Management Training:   Learning Objective:  Patient will have a greater understanding of diabetes self-management. Patient education plan is to attend individual and/or group sessions per assessed needs and concerns.   Plan:   Patient  Instructions  Choose water most often or flavor packets with 0-1 grams total carbohydrate such as  Crystal Light  True Lemon Balance your plate Fruits & Vegetables: Aim to fill half your plate with a variety of fruits and vegetables. They are rich in vitamins, minerals, and fiber, and can help reduce the risk of chronic diseases. Choose a colorful assortment of fruits and vegetables to ensure you get a wide range of nutrients. Grains and Starches: Make at least half of your grain choices whole grains, such as brown rice, whole wheat bread, and oats. Whole grains provide fiber, which aids in digestion and healthy cholesterol levels. Aim for whole forms of starchy vegetables such as potatoes, sweet potatoes, beans, peas, and corn, which are fiber rich and provide many vitamins and minerals.  Protein: Incorporate lean sources of protein, such as poultry, fish, beans, nuts, and seeds, into your meals. Protein is essential for building and repairing  tissues, staying full, balancing blood sugar, as well as supporting immune function. Dairy: Include low-fat or fat-free dairy products like milk, yogurt, and cheese in your diet. Dairy foods are excellent sources of calcium and vitamin D, which are crucial for bone health.  Physical Activity: Aim for 60 minutes of physical activity daily. Regular physical activity promotes overall health-including helping to reduce risk for heart disease and diabetes, promoting mental health, and helping [place] sleep better.    Plan:  Aim for 3-4 Carb Choices per meal (45-60 grams) +/- 1 either way  Aim for 0-1 Carbs per snack if hungry  Include protein in moderation with your meals and snacks Consider reading food labels for Total Carbohydrate of foods Consider  increasing your activity level by walk for 30 minutes daily as tolerated Consider checking BG at alternate times per day    Ask your doctor about your A1C and let me know what this is at your next visit.  Blood  glucose goals:  80-130 fasting   100-180 two hours after starting any meal  Generally a rise of 40-60 points after a meal is normal  A1C Goal:   Less than 7%.  Your last A1C was  Lab Results  Component Value Date   HGBA1C 6.8 (H) 05/17/2022         Expected Outcomes:  Demonstrated interest in learning. Expect positive outcomes  Education material provided:   If problems or questions, patient to contact team via:  Phone  Future DSME appointment: 3-4 months

## 2023-02-14 ENCOUNTER — Encounter: Payer: Self-pay | Admitting: Internal Medicine

## 2023-02-14 ENCOUNTER — Ambulatory Visit: Payer: Medicare HMO | Admitting: Internal Medicine

## 2023-02-14 VITALS — BP 130/82 | HR 75 | Temp 97.8°F | Resp 16 | Ht 70.0 in | Wt 226.6 lb

## 2023-02-14 DIAGNOSIS — Z23 Encounter for immunization: Secondary | ICD-10-CM

## 2023-02-14 DIAGNOSIS — Z0001 Encounter for general adult medical examination with abnormal findings: Secondary | ICD-10-CM

## 2023-02-14 DIAGNOSIS — E119 Type 2 diabetes mellitus without complications: Secondary | ICD-10-CM | POA: Diagnosis not present

## 2023-02-14 DIAGNOSIS — I10 Essential (primary) hypertension: Secondary | ICD-10-CM

## 2023-02-14 DIAGNOSIS — R300 Dysuria: Secondary | ICD-10-CM | POA: Diagnosis not present

## 2023-02-14 DIAGNOSIS — A510 Primary genital syphilis: Secondary | ICD-10-CM

## 2023-02-14 DIAGNOSIS — K7581 Nonalcoholic steatohepatitis (NASH): Secondary | ICD-10-CM

## 2023-02-14 DIAGNOSIS — N341 Nonspecific urethritis: Secondary | ICD-10-CM

## 2023-02-14 LAB — BASIC METABOLIC PANEL
BUN: 16 mg/dL (ref 6–23)
CO2: 30 meq/L (ref 19–32)
Calcium: 8.9 mg/dL (ref 8.4–10.5)
Chloride: 102 meq/L (ref 96–112)
Creatinine, Ser: 1 mg/dL (ref 0.40–1.50)
GFR: 92.79 mL/min (ref >60.00)
Glucose, Bld: 187 mg/dL — ABNORMAL HIGH (ref 70–99)
Potassium: 3.9 meq/L (ref 3.5–5.1)
Sodium: 137 meq/L (ref 135–145)

## 2023-02-14 LAB — CBC WITH DIFFERENTIAL/PLATELET
Basophils Absolute: 0.1 10*3/uL (ref 0.0–0.1)
Basophils Relative: 0.9 % (ref 0.0–3.0)
Eosinophils Absolute: 0.1 10*3/uL (ref 0.0–0.7)
Eosinophils Relative: 1.9 % (ref 0.0–5.0)
HCT: 44.2 % (ref 39.0–52.0)
Hemoglobin: 14.5 g/dL (ref 13.0–17.0)
Lymphocytes Relative: 36.6 % (ref 12.0–46.0)
Lymphs Abs: 2.5 10*3/uL (ref 0.7–4.0)
MCHC: 32.8 g/dL (ref 30.0–36.0)
MCV: 90.3 fL (ref 78.0–100.0)
Monocytes Absolute: 0.6 10*3/uL (ref 0.1–1.0)
Monocytes Relative: 8.8 % (ref 3.0–12.0)
Neutro Abs: 3.5 10*3/uL (ref 1.4–7.7)
Neutrophils Relative %: 51.8 % (ref 43.0–77.0)
Platelets: 250 10*3/uL (ref 150.0–400.0)
RBC: 4.9 Mil/uL (ref 4.22–5.81)
RDW: 13.4 % (ref 11.5–15.5)
WBC: 6.7 10*3/uL (ref 4.0–10.5)

## 2023-02-14 LAB — HEPATIC FUNCTION PANEL
ALT: 32 U/L (ref 0–53)
AST: 20 U/L (ref 0–37)
Albumin: 4.2 g/dL (ref 3.5–5.2)
Alkaline Phosphatase: 73 U/L (ref 39–117)
Bilirubin, Direct: 0.2 mg/dL (ref 0.0–0.3)
Total Bilirubin: 0.8 mg/dL (ref 0.2–1.2)
Total Protein: 7.8 g/dL (ref 6.0–8.3)

## 2023-02-14 LAB — HEMOGLOBIN A1C: Hgb A1c MFr Bld: 5.6 % (ref 4.6–6.5)

## 2023-02-14 NOTE — Progress Notes (Unsigned)
Subjective:  Patient ID: Derrick Benitez, male    DOB: Mar 05, 1981  Age: 42 y.o. MRN: [id number]  CC: Annual Exam, Hypertension, Hyperlipidemia, and Diabetes   HPI Derrick Benitez presents for a CPX and f/up ----   Outpatient Medications Prior to Visit  Medication Sig Dispense Refill   Accu-Chek Softclix Lancets lancets 1 each by Other route 2 (two) times daily. Use as instructed 100 each 2   Blood Glucose Monitoring Suppl (ACCU-CHEK GUIDE ME) w/Device KIT 1 Act by Does not apply route 2 (two) times daily. 1 kit 2   Cholecalciferol (VITAMIN D3) 50 MCG (2000 UT) capsule Take 1 capsule (2,000 Units total) by mouth daily. 100 capsule 3   Eluxadoline (VIBERZI) 75 MG TABS Take 1 tablet (75 mg total) by mouth 2 (two) times daily as needed. 180 tablet 0   glucose blood (ACCU-CHEK GUIDE) test strip 1 each by Other route 2 (two) times daily. Use as instructed 100 each 2   rosuvastatin (CRESTOR) 5 MG tablet TAKE 1 TABLET(5 MG) BY MOUTH DAILY 90 tablet 1   sildenafil (VIAGRA) 100 MG tablet Take 0.5-1 tablets (50-100 mg total) by mouth daily as needed for erectile dysfunction. 12 tablet 1   Vitamin D, Ergocalciferol, (DRISDOL) 1.25 MG (50000 UNIT) CAPS capsule Take 1 capsule (50,000 Units total) by mouth every 7 (seven) days. 8 capsule 0   vortioxetine HBr (TRINTELLIX) 10 MG TABS tablet Take 1 tablet (10 mg total) by mouth daily. 90 tablet 0   cyclobenzaprine (FLEXERIL) 5 MG tablet Take 1 tablet (5 mg total) by mouth at bedtime as needed for muscle spasms. 30 tablet 0   emtricitabine-tenofovir AF (DESCOVY) 200-25 MG tablet Take 1 tablet by mouth daily. 90 tablet 0   hydrOXYzine (ATARAX) 25 MG tablet Take 25 mg by mouth every 6 (six) hours as needed. (Patient not taking: Reported on 09/12/2022)     No facility-administered medications prior to visit.    ROS Review of Systems  Constitutional:  Positive for unexpected weight change (wt gain).  Cardiovascular:  Negative for chest pain, palpitations and leg  swelling.  Gastrointestinal: Negative.  Negative for abdominal pain, diarrhea, nausea and vomiting.  Genitourinary:  Positive for dysuria. Negative for decreased urine volume, difficulty urinating, flank pain, frequency, genital sores, penile discharge, penile pain, penile swelling, scrotal swelling, testicular pain and urgency.  Musculoskeletal: Negative.   Skin: Negative.  Negative for color change and rash.  Hematological:  Negative for adenopathy. Does not bruise/bleed easily.  Psychiatric/Behavioral: Negative.      Objective:  BP 130/82 (BP Location: Left Arm, Patient Position: Sitting, Cuff Size: Normal)   Pulse 75   Temp 97.8 F (36.6 C) (Oral)   Resp 16   Ht 5\' 10"  (1.778 m)   Wt 226 lb 9.6 oz (102.8 kg)   SpO2 91%   BMI 32.51 kg/m   BP Readings from Last 3 Encounters:  02/14/23 130/82  06/28/22 132/82  05/17/22 110/78    Wt Readings from Last 3 Encounters:  02/14/23 226 lb 9.6 oz (102.8 kg)  09/12/22 219 lb (99.3 kg)  06/28/22 226 lb (102.5 kg)    Physical Exam Exam conducted with a chaperone present.  Abdominal:     Hernia: There is no hernia in the left inguinal area or right inguinal area.  Genitourinary:    Pubic Area: No rash.      Penis: Normal and uncircumcised. No phimosis, paraphimosis, hypospadias, erythema, tenderness, discharge, swelling or lesions.      Testes:  Normal.     Epididymis:     Right: Normal.     Left: Normal.  Lymphadenopathy:     Lower Body: No right inguinal adenopathy. No left inguinal adenopathy.     Lab Results  Component Value Date   WBC 7.0 05/17/2022   HGB 15.9 05/17/2022   HCT 46.3 05/17/2022   PLT 266.0 05/17/2022   GLUCOSE 142 (H) 05/17/2022   CHOL 150 06/28/2022   TRIG 218.0 (H) 06/28/2022   HDL 39.30 06/28/2022   LDLDIRECT 92.0 06/28/2022   ALT 64 (H) 05/17/2022   AST 31 05/17/2022   NA 139 05/17/2022   K 3.8 05/17/2022   CL 103 05/17/2022   CREATININE 0.95 05/17/2022   BUN 18 05/17/2022   CO2 26  05/17/2022   TSH 1.27 05/17/2022   HGBA1C 6.8 (H) 05/17/2022   MICROALBUR 1.1 06/28/2022    MR CERVICAL SPINE WO CONTRAST  Result Date: 06/10/2020  [facility] [address], [address] [place], [place] [address] [phone] NEUROIMAGING REPORT STUDY DATE: 06/10/2020 PATIENT NAME: Imad Shostak DOB: May 03, 1980 MRN: [id number] EXAM: MRI of the cervical spine ORDERING CLINICIAN: Richard A. Sater, MD. PhD CLINICAL HISTORY: 42 year old man with an abnormal brain MRI, dysesthesias and neck pain COMPARISON FILMS: None TECHNIQUE: MRI of the cervical spine was obtained utilizing 3 mm sagittal slices from the posterior fossa down to the T3-4 level with T1, T2 and inversion recovery views. In addition 4 mm axial slices from C2-3 down to T1-2 level were included with T2 and gradient echo views. CONTRAST: None IMAGING SITE: [facility], [address] [place], [place], [place] FINDINGS: :  On sagittal images, the spine is imaged from above the cervicomedullary junction to T2.   Visible brain appears normal.  Paravertebral soft tissue appears normal.  The spinal cord is of normal caliber and signal.    Minimal reversal of the cervical curvature..  There is no spondylolisthesis.   The vertebral bodies have normal signal.  The discs and interspaces were further evaluated on axial views from C2 to T1 as follows: C2 - C3:  The disc and interspace appear normal. C3 - C4: There is minimal disc bulging.  There is no foraminal narrowing or spinal stenosis.  No nerve root compression.  C4 - C5:  There is minimal disc bulging.  There is no foraminal narrowing or spinal stenosis.  No nerve root compression.  C5 - C6:  The disc and interspace appear normal. C6 - C7:  There is minimal disc bulging.  There is no foraminal narrowing or spinal stenosis.  No nerve root compression.  C7 - T1:  The disc and interspace appear normal.   This MRI of the cervical spine without contrast shows the following: 1.   The spinal cord  appears normal.  There is no evidence of demyelination. 2.   Minimal disc degenerative changes at C3-C4 and C4-C5 and C6-C7 that does not cause spinal stenosis or nerve root compression. INTERPRETING PHYSICIAN: Richard A. Epimenio Foot, MD, PhD, FAAN Certified in  Neuroimaging by [employer]    Assessment & Plan:  Primary genital syphilis -     RPR; Future -     HIV Antibody (routine testing w rflx); Future  NASH (nonalcoholic steatohepatitis) -     Hepatic function panel; Future  Primary hypertension -     Basic metabolic panel; Future -     CBC with Differential/Platelet; Future  Type 2 diabetes mellitus without complication, without long-term current use of  insulin (HCC) -     Basic metabolic panel; Future -     Hemoglobin A1c; Future  Need for immunization against influenza -     Flu vaccine trivalent PF, 6mos and older(Flulaval,Afluria,Fluarix,Fluzone)  Dysuria -     Urinalysis, Routine w reflex microscopic; Future -     C. trachomatis/N. gonorrhoeae RNA; Future -     HIV Antibody (routine testing w rflx); Future  Encounter for general adult medical examination with abnormal findings     Follow-up: Return in about 6 months (around 08/14/2023).  Sanda Linger, MD

## 2023-02-14 NOTE — Patient Instructions (Signed)
Health Maintenance, Male Adopting a healthy lifestyle and getting preventive care are important in promoting health and wellness. Ask your health care provider about: The right schedule for you to have regular tests and exams. Things you can do on your own to prevent diseases and keep yourself healthy. What should I know about diet, weight, and exercise? Eat a healthy diet  Eat a diet that includes plenty of vegetables, fruits, low-fat dairy products, and lean protein. Do not eat a lot of foods that are high in solid fats, added sugars, or sodium. Maintain a healthy weight Body mass index (BMI) is a measurement that can be used to identify possible weight problems. It estimates body fat based on height and weight. Your health care provider can help determine your BMI and help you achieve or maintain a healthy weight. Get regular exercise Get regular exercise. This is one of the most important things you can do for your health. Most adults should: Exercise for at least 150 minutes each week. The exercise should increase your heart rate and make you sweat (moderate-intensity exercise). Do strengthening exercises at least twice a week. This is in addition to the moderate-intensity exercise. Spend less time sitting. Even light physical activity can be beneficial. Watch cholesterol and blood lipids Have your blood tested for lipids and cholesterol at 42 years of age, then have this test every 5 years. You may need to have your cholesterol levels checked more often if: Your lipid or cholesterol levels are high. You are older than 42 years of age. You are at high risk for heart disease. What should I know about cancer screening? Many types of cancers can be detected early and may often be prevented. Depending on your health history and family history, you may need to have cancer screening at various ages. This may include screening for: Colorectal cancer. Prostate cancer. Skin cancer. Lung  cancer. What should I know about heart disease, diabetes, and high blood pressure? Blood pressure and heart disease High blood pressure causes heart disease and increases the risk of stroke. This is more likely to develop in people who have high blood pressure readings or are overweight. Talk with your health care provider about your target blood pressure readings. Have your blood pressure checked: Every 3-5 years if you are 18-39 years of age. Every year if you are 40 years old or older. If you are between the ages of 65 and 75 and are a current or former smoker, ask your health care provider if you should have a one-time screening for abdominal aortic aneurysm (AAA). Diabetes Have regular diabetes screenings. This checks your fasting blood sugar level. Have the screening done: Once every three years after age 45 if you are at a normal weight and have a low risk for diabetes. More often and at a younger age if you are overweight or have a high risk for diabetes. What should I know about preventing infection? Hepatitis B If you have a higher risk for hepatitis B, you should be screened for this virus. Talk with your health care provider to find out if you are at risk for hepatitis B infection. Hepatitis C Blood testing is recommended for: Everyone born from 1945 through 1965. Anyone with known risk factors for hepatitis C. Sexually transmitted infections (STIs) You should be screened each year for STIs, including gonorrhea and chlamydia, if: You are sexually active and are younger than 42 years of age. You are older than 42 years of age and your   health care provider tells you that you are at risk for this type of infection. Your sexual activity has changed since you were last screened, and you are at increased risk for chlamydia or gonorrhea. Ask your health care provider if you are at risk. Ask your health care provider about whether you are at high risk for HIV. Your health care provider  may recommend a prescription medicine to help prevent HIV infection. If you choose to take medicine to prevent HIV, you should first get tested for HIV. You should then be tested every 3 months for as long as you are taking the medicine. Follow these instructions at home: Alcohol use Do not drink alcohol if your health care provider tells you not to drink. If you drink alcohol: Limit how much you have to 0-2 drinks a day. Know how much alcohol is in your drink. In the [place] one drink equals one 12 oz bottle of beer (355 mL), one 5 oz glass of wine (148 mL), or one 1 oz glass of hard liquor (44 mL). Lifestyle Do not use any products that contain nicotine or tobacco. These products include cigarettes, chewing tobacco, and vaping devices, such as e-cigarettes. If you need help quitting, ask your health care provider. Do not use street drugs. Do not share needles. Ask your health care provider for help if you need support or information about quitting drugs. General instructions Schedule regular health, dental, and eye exams. Stay current with your vaccines. Tell your health care provider if: You often feel depressed. You have ever been abused or do not feel safe at home. Summary Adopting a healthy lifestyle and getting preventive care are important in promoting health and wellness. Follow your health care provider's instructions about healthy diet, exercising, and getting tested or screened for diseases. Follow your health care provider's instructions on monitoring your cholesterol and blood pressure. This information is not intended to replace advice given to you by your health care provider. Make sure you discuss any questions you have with your health care provider. Document Revised: 08/17/2020 Document Reviewed: 08/17/2020 Elsevier Patient Education  2024 [employer]  

## 2023-02-15 LAB — URINALYSIS, ROUTINE W REFLEX MICROSCOPIC
Bilirubin Urine: NEGATIVE
Hgb urine dipstick: NEGATIVE
Ketones, ur: NEGATIVE
Leukocytes,Ua: NEGATIVE
Nitrite: NEGATIVE
RBC / HPF: NONE SEEN (ref 0–?)
Specific Gravity, Urine: 1.025 (ref 1.000–1.030)
Total Protein, Urine: NEGATIVE
Urine Glucose: NEGATIVE
Urobilinogen, UA: 0.2 (ref 0.0–1.0)
pH: 5.5 (ref 5.0–8.0)

## 2023-02-15 LAB — C. TRACHOMATIS/N. GONORRHOEAE RNA
C. trachomatis RNA, TMA: NOT DETECTED
N. gonorrhoeae RNA, TMA: NOT DETECTED

## 2023-02-15 LAB — RPR: RPR Ser Ql: NONREACTIVE

## 2023-02-15 LAB — HIV ANTIBODY (ROUTINE TESTING W REFLEX): HIV 1&2 Ab, 4th Generation: NONREACTIVE

## 2023-02-16 DIAGNOSIS — N341 Nonspecific urethritis: Secondary | ICD-10-CM | POA: Insufficient documentation

## 2023-02-16 MED ORDER — DOXYCYCLINE HYCLATE 100 MG PO TABS: 100.0000 mg | ORAL_TABLET | Freq: Two times a day (BID) | ORAL | 0 refills | Status: AC

## 2023-02-20 ENCOUNTER — Telehealth: Payer: Self-pay | Admitting: Internal Medicine

## 2023-02-20 NOTE — Telephone Encounter (Signed)
Patient called and said they were waiting on Dr. Yetta Barre to send Descovy to their pharmacy, but they haven't heard anything. They would like to know if Dr. Yetta Barre is going to send it in. Patient would like a call back at [phone].

## 2023-02-21 NOTE — Telephone Encounter (Signed)
Unable to reach patient. LMTRC  

## 2023-02-22 ENCOUNTER — Other Ambulatory Visit: Payer: Self-pay | Admitting: Internal Medicine

## 2023-02-22 DIAGNOSIS — Z7252 High risk homosexual behavior: Secondary | ICD-10-CM

## 2023-02-22 MED ORDER — DESCOVY 200-25 MG PO TABS: 1.0000 | ORAL_TABLET | Freq: Every day | ORAL | 0 refills | Status: DC

## 2023-02-22 NOTE — Telephone Encounter (Signed)
Patient states that the medication he's talking about is to help prevent HIV. It states with a "D" and wants to know if you could send it in

## 2023-04-14 ENCOUNTER — Ambulatory Visit: Payer: Medicare HMO | Admitting: Dietician

## 2023-04-21 ENCOUNTER — Encounter: Payer: Self-pay | Admitting: Dietician

## 2023-04-21 ENCOUNTER — Encounter: Payer: Medicare HMO | Attending: Internal Medicine | Admitting: Dietician

## 2023-04-21 DIAGNOSIS — E119 Type 2 diabetes mellitus without complications: Secondary | ICD-10-CM | POA: Diagnosis not present

## 2023-04-21 NOTE — Patient Instructions (Signed)
 Balance your plate Fruits & Vegetables: Aim to fill half your plate with a variety of fruits and vegetables. They are rich in vitamins, minerals, and fiber, and can help reduce the risk of chronic diseases. Choose a colorful assortment of fruits and vegetables to ensure you get a wide range of nutrients. Grains and Starches: Make at least half of your grain choices whole grains, such as brown rice, whole wheat bread, and oats. Whole grains provide fiber, which aids in digestion and healthy cholesterol levels. Aim for whole forms of starchy vegetables such as potatoes, sweet potatoes, beans, peas, and corn, which are fiber rich and provide many vitamins and minerals.  Protein: Incorporate lean sources of protein, such as poultry, fish, beans, nuts, and seeds, into your meals. Protein is essential for building and repairing tissues, staying full, balancing blood sugar, as well as supporting immune function. Dairy: Include low-fat or fat-free dairy products like milk, yogurt, and cheese in your diet. Dairy foods are excellent sources of calcium  and vitamin D , which are crucial for bone health.  Physical Activity: Aim for 60 minutes of physical activity daily. Regular physical activity promotes overall health-including helping to reduce risk for heart disease and diabetes, promoting mental health, and helping us  sleep better.    Blood glucose goals:  80-130 fasting   100-180 two hours after starting any meal  Generally a rise of 40-60 points after a meal is normal  A1C Goal:   Less than 7%.  Your last A1C was  Lab Results  Component Value Date   HGBA1C 5.6 02/14/2023      Plan:  Aim for 3-4 Carb Choices per meal (45-60 grams) +/- 1 either way  Aim for 0-1 Carbs per snack if hungry  Include protein in moderation with your meals and snacks Consider reading food labels for Total Carbohydrate of foods Consider  increasing your activity level by walk for 30 minutes daily as tolerated Consider  checking BG at alternate times per day

## 2023-04-21 NOTE — Progress Notes (Signed)
 Diabetes Self-Management Education  Visit Type: Follow-up  Appt. Start Time: 1005 Appt. End Time: 1035  04/28/2023  [name], identified by name and date of birth, is a 43 y.o. male with a diagnosis of Diabetes: Type 2.   ASSESSMENT Patient is here today with his interpretor from [facility] (sign language), Delon Doyne. He was last seen by this RD on 02/10/2023.   He is checking his blood glucose after a meal.  It was 211 last night after pasta. States that he feels like he is always hungry.    History includes: Type 2 diabetes, depression, deaf, IBS-D (since age 25) Medication includes:  Rosuvastatin , viberzi  (for IBS and helps his appetite per patient), Vitamin D3 Labs noted to include A1C 5.6% 02/14/2023 decreased from 6.8% 05/17/2022, Vitamin D  16 05/17/2022 Lipid Panel              Component Value Date/Time    CHOL 150 06/28/2022 1141    TRIG 218.0 (H) 06/28/2022 1141    HDL 39.30 06/28/2022 1141    CHOLHDL 4 06/28/2022 1141    VLDL 43.6 (H) 06/28/2022 1141    LDLDIRECT 92.0 06/28/2022 1141    Weight hx: 70 227 lbs 04/20/2022 223 lbs 02/10/2023 218 lbs 11/28/2022 219 lbs 09/12/2022 235 lbs lost weight by eating less as MD recommended weight loss due to his stomach.  He states that he gained weight in [place] after taking a medication for depression. 190 lbs UBW prior to medication   Patient lives alone.   He has been in [place] since 2005. He works at [employer] 5 days per week. Eats Richelle Subs out frequently ([employer]) Does not tolerate dairy and uses soy or almond milk (unsweetened).  Tolerates mozerella.  Walks 3 days per week and some weights at home.   Diabetes Self-Management Education - 04/28/23 1400       Visit Information   Visit Type Follow-up      Initial Visit   Diabetes Type Type 2    Are you taking your medications as prescribed? Not on Medications      Health Coping   How would you rate your overall health? Good       Psychosocial Assessment   Self-care barriers None    Self-management support Doctor's office    Other persons present Patient;Interpreter    Patient Concerns Nutrition/Meal planning;Glycemic Control    Special Needs Other (comment)   visual   Preferred Learning Style Visual    Learning Readiness Ready      Pre-Education Assessment   Patient understands the diabetes disease and treatment process. Needs Review    Patient understands incorporating nutritional management into lifestyle. Needs Review    Patient undertands incorporating physical activity into lifestyle. Needs Review    Patient understands using medications safely. Needs Review    Patient understands monitoring blood glucose, interpreting and using results Needs Review    Patient understands prevention, detection, and treatment of acute complications. Needs Review    Patient understands prevention, detection, and treatment of chronic complications. Needs Review    Patient understands how to develop strategies to address psychosocial issues. Needs Review    Patient understands how to develop strategies to promote health/change behavior. Needs Review      Complications   Last HgB A1C per patient/outside source 5.6 %   02/14/2023   How often do you check your blood sugar? Not recommended by provider      Dietary Intake  Breakfast skips    Lunch salad with 2 slices pizza    Dinner 3 hotdogs with buns    Beverage(s) water, lemon water, sprite zero, almond milk      Activity / Exercise   Activity / Exercise Type Light (walking / raking leaves)    How many days per week do you exercise? 3    How many minutes per day do you exercise? 75    Total minutes per week of exercise 225      Patient Education   Previous Diabetes Education Yes (please comment)   02/2023   Healthy Eating Meal options for control of blood glucose level and chronic complications.    Being Active Role of exercise on diabetes management, blood pressure  control and cardiac health.    Diabetes Stress and Support Identified and addressed patients feelings and concerns about diabetes;Worked with patient to identify barriers to care and solutions      Individualized Goals (developed by patient)   Nutrition General guidelines for healthy choices and portions discussed    Physical Activity Exercise 5-7 days per week;30 minutes per day    Medications Not Applicable    Problem Solving Eating Pattern    Reducing Risk do foot checks daily      Patient Self-Evaluation of Goals - Patient rates self as meeting previously set goals (% of time)   Nutrition 50 - 75 % (half of the time)    Physical Activity 50 - 75 % (half of the time)    Medications Not Applicable    Monitoring Not Applicable    Problem Solving and behavior change strategies  50 - 75 % (half of the time)    Reducing Risk (treating acute and chronic complications) 50 - 75 % (half of the time)    Health Coping 50 - 75 % (half of the time)      Post-Education Assessment   Patient understands the diabetes disease and treatment process. Comprehends key points    Patient understands incorporating nutritional management into lifestyle. Needs Review    Patient undertands incorporating physical activity into lifestyle. Comprehends key points    Patient understands using medications safely. Comphrehends key points    Patient understands monitoring blood glucose, interpreting and using results Comprehends key points    Patient understands prevention, detection, and treatment of acute complications. Comprehends key points    Patient understands prevention, detection, and treatment of chronic complications. Comprehends key points    Patient understands how to develop strategies to address psychosocial issues. Comprehends key points    Patient understands how to develop strategies to promote health/change behavior. Needs Review      Outcomes   Expected Outcomes Demonstrated interest in learning.  Expect positive outcomes    Future DMSE PRN    Program Status Completed      Subsequent Visit   Since your last visit have you continued or begun to take your medications as prescribed? Not on Medications             Individualized Plan for Diabetes Self-Management Training:   Learning Objective:  Patient will have a greater understanding of diabetes self-management. Patient education plan is to attend individual and/or group sessions per assessed needs and concerns.   Plan:   Patient Instructions  Balance your plate Fruits & Vegetables: Aim to fill half your plate with a variety of fruits and vegetables. They are rich in vitamins, minerals, and fiber, and can help reduce the risk of chronic  diseases. Choose a colorful assortment of fruits and vegetables to ensure you get a wide range of nutrients. Grains and Starches: Make at least half of your grain choices whole grains, such as brown rice, whole wheat bread, and oats. Whole grains provide fiber, which aids in digestion and healthy cholesterol levels. Aim for whole forms of starchy vegetables such as potatoes, sweet potatoes, beans, peas, and corn, which are fiber rich and provide many vitamins and minerals.  Protein: Incorporate lean sources of protein, such as poultry, fish, beans, nuts, and seeds, into your meals. Protein is essential for building and repairing tissues, staying full, balancing blood sugar, as well as supporting immune function. Dairy: Include low-fat or fat-free dairy products like milk, yogurt, and cheese in your diet. Dairy foods are excellent sources of calcium  and vitamin D , which are crucial for bone health.  Physical Activity: Aim for 60 minutes of physical activity daily. Regular physical activity promotes overall health-including helping to reduce risk for heart disease and diabetes, promoting mental health, and helping us  sleep better.    Blood glucose goals:  80-130 fasting   100-180 two hours after  starting any meal  Generally a rise of 40-60 points after a meal is normal  A1C Goal:   Less than 7%.  Your last A1C was  Lab Results  Component Value Date   HGBA1C 5.6 02/14/2023      Plan:  Aim for 3-4 Carb Choices per meal (45-60 grams) +/- 1 either way  Aim for 0-1 Carbs per snack if hungry  Include protein in moderation with your meals and snacks Consider reading food labels for Total Carbohydrate of foods Consider  increasing your activity level by walk for 30 minutes daily as tolerated Consider checking BG at alternate times per day     Expected Outcomes:  Demonstrated interest in learning. Expect positive outcomes  Education material provided:   If problems or questions, patient to contact team via:  Phone  Future DSME appointment: PRN

## 2023-05-10 DIAGNOSIS — R070 Pain in throat: Secondary | ICD-10-CM | POA: Diagnosis not present

## 2023-05-10 DIAGNOSIS — R509 Fever, unspecified: Secondary | ICD-10-CM | POA: Diagnosis not present

## 2023-05-10 DIAGNOSIS — R059 Cough, unspecified: Secondary | ICD-10-CM | POA: Diagnosis not present

## 2023-05-14 ENCOUNTER — Other Ambulatory Visit: Payer: Self-pay | Admitting: Internal Medicine

## 2023-05-14 DIAGNOSIS — K580 Irritable bowel syndrome with diarrhea: Secondary | ICD-10-CM

## 2023-05-15 MED ORDER — VIBERZI 75 MG PO TABS: 1.0000 | ORAL_TABLET | Freq: Two times a day (BID) | ORAL | 0 refills | Status: DC | PRN

## 2023-05-17 ENCOUNTER — Telehealth (HOSPITAL_COMMUNITY): Payer: Self-pay

## 2023-05-17 ENCOUNTER — Other Ambulatory Visit (HOSPITAL_COMMUNITY): Payer: Self-pay

## 2023-05-17 NOTE — Telephone Encounter (Signed)
 Pharmacy Patient Advocate Encounter   Received notification from CoverMyMeds that prior authorization for Viberzi  75 mg tablets is required/requested.   Insurance verification completed.   The patient is insured through [facility] .   Per test claim: PA required; PA started via CoverMyMeds. KEY [id number] . Waiting for clinical questions to populate.

## 2023-05-21 ENCOUNTER — Other Ambulatory Visit: Payer: Self-pay | Admitting: Internal Medicine

## 2023-05-21 DIAGNOSIS — Z7252 High risk homosexual behavior: Secondary | ICD-10-CM

## 2023-05-22 ENCOUNTER — Other Ambulatory Visit (HOSPITAL_COMMUNITY): Payer: Self-pay

## 2023-05-31 DIAGNOSIS — R519 Headache, unspecified: Secondary | ICD-10-CM | POA: Diagnosis not present

## 2023-05-31 DIAGNOSIS — R221 Localized swelling, mass and lump, neck: Secondary | ICD-10-CM | POA: Diagnosis not present

## 2023-05-31 DIAGNOSIS — Z1152 Encounter for screening for COVID-19: Secondary | ICD-10-CM | POA: Diagnosis not present

## 2023-05-31 DIAGNOSIS — M542 Cervicalgia: Secondary | ICD-10-CM | POA: Diagnosis not present

## 2023-05-31 DIAGNOSIS — J020 Streptococcal pharyngitis: Secondary | ICD-10-CM | POA: Diagnosis not present

## 2023-05-31 DIAGNOSIS — B340 Adenovirus infection, unspecified: Secondary | ICD-10-CM | POA: Diagnosis not present

## 2023-06-06 NOTE — Telephone Encounter (Signed)
 Pharmacy Patient Advocate Encounter   Received notification from CoverMyMeds that prior authorization for Viberzi 75MG  tablets is required/requested.   Insurance verification completed.   The patient is insured through [employer] .   Per test claim: PA required; PA submitted to above mentioned insurance via CoverMyMeds Key/confirmation #/EOC [employer] Status is pending  *previous set of questions had expired, renewed and sent

## 2023-06-08 NOTE — Telephone Encounter (Signed)
 Pharmacy Patient Advocate Encounter  Received notification from [employer] [employer] that Prior Authorization for Viberzi 75MG  tablets has been APPROVED from 04-12-2023 to 04-10-2024   PA #/Case ID/Reference #: [id number]

## 2023-06-08 NOTE — Telephone Encounter (Signed)
 Patient has been made aware.

## 2023-06-22 ENCOUNTER — Other Ambulatory Visit: Payer: Self-pay | Admitting: Internal Medicine

## 2023-06-22 DIAGNOSIS — E785 Hyperlipidemia, unspecified: Secondary | ICD-10-CM

## 2023-06-28 DIAGNOSIS — L03114 Cellulitis of left upper limb: Secondary | ICD-10-CM | POA: Diagnosis not present

## 2023-07-13 LAB — HM DIABETES EYE EXAM

## 2023-08-11 ENCOUNTER — Telehealth: Payer: Self-pay | Admitting: Internal Medicine

## 2023-08-11 NOTE — Telephone Encounter (Signed)
 [facility] interpreters are booked for Monday on 5.5.25. The only resource available to communicate with this patient would be the VRI-video remote Interpreter on the Ipad which does provide live ASL Interpretation.... that is IF patient/Provider consent to it for the visit he's able to keep his appt for 5.5.25 if not then we will need to r/s on a day other than MONDAY. This message was left on [phone]; using VRI as well to the patient to give us  a call back with his consent.  Please Advise, Thanks

## 2023-08-14 ENCOUNTER — Encounter: Payer: Self-pay | Admitting: Internal Medicine

## 2023-08-14 ENCOUNTER — Ambulatory Visit (INDEPENDENT_AMBULATORY_CARE_PROVIDER_SITE_OTHER): Payer: Medicare HMO | Admitting: Internal Medicine

## 2023-08-14 VITALS — BP 126/74 | HR 61 | Temp 98.1°F | Ht 70.0 in | Wt 220.6 lb

## 2023-08-14 DIAGNOSIS — I10 Essential (primary) hypertension: Secondary | ICD-10-CM

## 2023-08-14 DIAGNOSIS — R300 Dysuria: Secondary | ICD-10-CM

## 2023-08-14 DIAGNOSIS — Z114 Encounter for screening for human immunodeficiency virus [HIV]: Secondary | ICD-10-CM

## 2023-08-14 DIAGNOSIS — E119 Type 2 diabetes mellitus without complications: Secondary | ICD-10-CM

## 2023-08-14 DIAGNOSIS — E785 Hyperlipidemia, unspecified: Secondary | ICD-10-CM

## 2023-08-14 DIAGNOSIS — A510 Primary genital syphilis: Secondary | ICD-10-CM

## 2023-08-14 DIAGNOSIS — N341 Nonspecific urethritis: Secondary | ICD-10-CM

## 2023-08-14 LAB — BASIC METABOLIC PANEL WITH GFR
BUN: 19 mg/dL (ref 6–23)
CO2: 30 meq/L (ref 19–32)
Calcium: 9.4 mg/dL (ref 8.4–10.5)
Chloride: 100 meq/L (ref 96–112)
Creatinine, Ser: 1.01 mg/dL (ref 0.40–1.50)
GFR: 91.37 mL/min (ref >60.00)
Glucose, Bld: 170 mg/dL — ABNORMAL HIGH (ref 70–99)
Potassium: 3.7 meq/L (ref 3.5–5.1)
Sodium: 136 meq/L (ref 135–145)

## 2023-08-14 LAB — LIPID PANEL
Cholesterol: 91 mg/dL (ref 0–200)
HDL: 32.2 mg/dL — ABNORMAL LOW (ref >39.00)
LDL Cholesterol: 33 mg/dL (ref 0–99)
NonHDL: 59.24
Total CHOL/HDL Ratio: 3
Triglycerides: 130 mg/dL (ref 0.0–149.0)
VLDL: 26 mg/dL (ref 0.0–40.0)

## 2023-08-14 LAB — URINALYSIS, ROUTINE W REFLEX MICROSCOPIC
Hgb urine dipstick: NEGATIVE
Leukocytes,Ua: NEGATIVE
Nitrite: NEGATIVE
RBC / HPF: NONE SEEN (ref 0–?)
Specific Gravity, Urine: >=1.03 — AB (ref 1.000–1.030)
Total Protein, Urine: NEGATIVE
Urine Glucose: NEGATIVE
Urobilinogen, UA: 0.2 (ref 0.0–1.0)
pH: 6 (ref 5.0–8.0)

## 2023-08-14 LAB — MICROALBUMIN / CREATININE URINE RATIO
Creatinine,U: 233.2 mg/dL
Microalb Creat Ratio: 5.8 mg/g (ref 0.0–30.0)
Microalb, Ur: 1.4 mg/dL (ref 0.0–1.9)

## 2023-08-14 LAB — HEMOGLOBIN A1C: Hgb A1c MFr Bld: 5.9 % (ref 4.6–6.5)

## 2023-08-14 NOTE — Patient Instructions (Signed)

## 2023-08-14 NOTE — Progress Notes (Unsigned)
 Subjective:  Patient ID: Derrick Benitez, male    DOB: May 15, 1980  Age: 43 y.o. MRN: [id number]  CC: Medical Management of Chronic Issues (6 month follow up. No concerns )   HPI [name] presents for f/up ----  Discussed the use of AI scribe software for clinical note transcription with the patient, who gave verbal consent to proceed.  History of Present Illness   Derrick Benitez is a 43 year old male who presents for a routine follow-up visit.  He experiences a slight burning sensation when holding his urine for an extended period before voiding. No painful urination or penile discharge. No fever, chills, night sweats, rashes, swollen lymph nodes, testicular pain, or swelling.  He notes occasional bowel movement irregularities, describing them as sometimes being between diarrhea and constipation, but not consistently problematic. No abdominal pain.  He is currently taking Descovy  without any side effects. He monitors his blood sugar levels, which are generally around 122, with a maximum of 190. He has not had any concerning sexual encounters.  He had an eye exam about a month ago in preparation for getting new glasses.       Outpatient Medications Prior to Visit  Medication Sig Dispense Refill   Accu-Chek Softclix Lancets lancets 1 each by Other route 2 (two) times daily. Use as instructed 100 each 2   Blood Glucose Monitoring Suppl (ACCU-CHEK GUIDE ME) w/Device KIT 1 Act by Does not apply route 2 (two) times daily. 1 kit 2   Cholecalciferol (VITAMIN D3) 50 MCG (2000 UT) capsule Take 1 capsule (2,000 Units total) by mouth daily. 100 capsule 3   DESCOVY  200-25 MG tablet TAKE 1 TABLET BY MOUTH DAILY 90 tablet 0   Eluxadoline  (VIBERZI ) 75 MG TABS Take 1 tablet (75 mg total) by mouth 2 (two) times daily as needed. 180 tablet 0   glucose blood (ACCU-CHEK GUIDE) test strip 1 each by Other route 2 (two) times daily. Use as instructed 100 each 2   rosuvastatin  (CRESTOR ) 5 MG tablet TAKE 1  TABLET(5 MG) BY MOUTH DAILY 90 tablet 1   sildenafil  (VIAGRA ) 100 MG tablet Take 0.5-1 tablets (50-100 mg total) by mouth daily as needed for erectile dysfunction. 12 tablet 1   Vitamin D , Ergocalciferol , (DRISDOL ) 1.25 MG (50000 UNIT) CAPS capsule Take 1 capsule (50,000 Units total) by mouth every 7 (seven) days. 8 capsule 0   vortioxetine  HBr (TRINTELLIX ) 10 MG TABS tablet Take 1 tablet (10 mg total) by mouth daily. 90 tablet 0   hydrOXYzine  (ATARAX ) 25 MG tablet Take 25 mg by mouth every 6 (six) hours as needed.     No facility-administered medications prior to visit.    ROS Review of Systems  Objective:  BP 126/74 (BP Location: Left Arm, Patient Position: Sitting, Cuff Size: Normal)   Pulse 61   Temp 98.1 F (36.7 C) (Oral)   Ht 5\' 10"  (1.778 m)   Wt 220 lb 9.6 oz (100.1 kg)   SpO2 92%   BMI 31.65 kg/m   BP Readings from Last 3 Encounters:  08/14/23 126/74  02/14/23 130/82  06/28/22 132/82    Wt Readings from Last 3 Encounters:  08/14/23 220 lb 9.6 oz (100.1 kg)  02/14/23 226 lb 9.6 oz (102.8 kg)  09/12/22 219 lb (99.3 kg)    Physical Exam  Lab Results  Component Value Date   WBC 6.7 02/14/2023   HGB 14.5 02/14/2023   HCT 44.2 02/14/2023   PLT 250.0 02/14/2023   GLUCOSE  170 (H) 08/14/2023   CHOL 91 08/14/2023   TRIG 130.0 08/14/2023   HDL 32.20 (L) 08/14/2023   LDLDIRECT 92.0 06/28/2022   LDLCALC 33 08/14/2023   ALT 32 02/14/2023   AST 20 02/14/2023   NA 136 08/14/2023   K 3.7 08/14/2023   CL 100 08/14/2023   CREATININE 1.01 08/14/2023   BUN 19 08/14/2023   CO2 30 08/14/2023   TSH 1.27 05/17/2022   HGBA1C 5.9 08/14/2023   MICROALBUR 1.4 08/14/2023    MR CERVICAL SPINE WO CONTRAST Result Date: 06/10/2020  [facility] [address], [address] [place], [place] [address] [phone] NEUROIMAGING REPORT STUDY DATE: 06/10/2020 PATIENT NAME: Derrick Benitez DOB: December 06, 1980 MRN: [id number] EXAM: MRI of the cervical spine ORDERING CLINICIAN: Richard A.  Sater, MD. PhD CLINICAL HISTORY: 43 year old man with an abnormal brain MRI, dysesthesias and neck pain COMPARISON FILMS: None TECHNIQUE: MRI of the cervical spine was obtained utilizing 3 mm sagittal slices from the posterior fossa down to the T3-4 level with T1, T2 and inversion recovery views. In addition 4 mm axial slices from C2-3 down to T1-2 level were included with T2 and gradient echo views. CONTRAST: None IMAGING SITE: [facility], [address] [place], [place], [place] FINDINGS: :  On sagittal images, the spine is imaged from above the cervicomedullary junction to T2.   Visible brain appears normal.  Paravertebral soft tissue appears normal.  The spinal cord is of normal caliber and signal.    Minimal reversal of the cervical curvature..  There is no spondylolisthesis.   The vertebral bodies have normal signal.  The discs and interspaces were further evaluated on axial views from C2 to T1 as follows: C2 - C3:  The disc and interspace appear normal. C3 - C4: There is minimal disc bulging.  There is no foraminal narrowing or spinal stenosis.  No nerve root compression.  C4 - C5:  There is minimal disc bulging.  There is no foraminal narrowing or spinal stenosis.  No nerve root compression.  C5 - C6:  The disc and interspace appear normal. C6 - C7:  There is minimal disc bulging.  There is no foraminal narrowing or spinal stenosis.  No nerve root compression.  C7 - T1:  The disc and interspace appear normal.   This MRI of the cervical spine without contrast shows the following: 1.   The spinal cord appears normal.  There is no evidence of demyelination. 2.   Minimal disc degenerative changes at C3-C4 and C4-C5 and C6-C7 that does not cause spinal stenosis or nerve root compression. INTERPRETING PHYSICIAN: Richard A. Godwin Lat, MD, PhD, FAAN Certified in  Neuroimaging by [employer]    Assessment & Plan:  Type 2 diabetes mellitus without complication, without long-term current use of  insulin (HCC) -     Basic metabolic panel with GFR; Future -     Hemoglobin A1c; Future -     Urinalysis, Routine w reflex microscopic; Future -     Microalbumin / creatinine urine ratio; Future  Primary hypertension -     Basic metabolic panel with GFR; Future -     Urinalysis, Routine w reflex microscopic; Future  Hyperlipidemia LDL goal <160 -     Lipid panel; Future  Dysuria  Primary genital syphilis -     HIV Antibody (routine testing w rflx); Future -     RPR; Future  NGU (nongonococcal urethritis)  Encounter for screening for HIV     Follow-up: Return  in about 6 months (around 02/14/2024).  Sandra Crouch, MD

## 2023-08-15 LAB — HIV ANTIBODY (ROUTINE TESTING W REFLEX): HIV 1&2 Ab, 4th Generation: NONREACTIVE

## 2023-08-15 LAB — RPR: RPR Ser Ql: NONREACTIVE

## 2023-08-16 ENCOUNTER — Telehealth: Payer: Self-pay | Admitting: Internal Medicine

## 2023-08-16 ENCOUNTER — Encounter: Payer: Self-pay | Admitting: Internal Medicine

## 2023-08-16 NOTE — Telephone Encounter (Signed)
 Patient has been seen and We used the LIVE ASL on the stratus no interpreter was present

## 2023-08-16 NOTE — Telephone Encounter (Signed)
 Copied from CRM [id number]. Topic: Clinical - Lab/Test Results >> Aug 16, 2023 11:46 AM Allyne Areola wrote: Reason for CRM: Patient would like to know if Dr.Jones can give him a call regarding his Lab results for HIV. He received a message but was confused regarding the results as he could not understand the sign language interpreter. Best call back number is [phone].

## 2023-08-17 NOTE — Telephone Encounter (Signed)
 This has been handled via Mychart

## 2023-08-23 ENCOUNTER — Other Ambulatory Visit: Payer: Self-pay | Admitting: Internal Medicine

## 2023-08-23 DIAGNOSIS — Z7252 High risk homosexual behavior: Secondary | ICD-10-CM

## 2023-09-15 DIAGNOSIS — R1084 Generalized abdominal pain: Secondary | ICD-10-CM | POA: Diagnosis not present

## 2023-09-15 DIAGNOSIS — R739 Hyperglycemia, unspecified: Secondary | ICD-10-CM | POA: Diagnosis not present

## 2023-09-15 DIAGNOSIS — K420 Umbilical hernia with obstruction, without gangrene: Secondary | ICD-10-CM | POA: Diagnosis not present

## 2023-09-15 DIAGNOSIS — R7401 Elevation of levels of liver transaminase levels: Secondary | ICD-10-CM | POA: Diagnosis not present

## 2023-09-15 DIAGNOSIS — R109 Unspecified abdominal pain: Secondary | ICD-10-CM | POA: Diagnosis not present

## 2023-09-15 DIAGNOSIS — E785 Hyperlipidemia, unspecified: Secondary | ICD-10-CM | POA: Diagnosis not present

## 2023-09-15 DIAGNOSIS — K219 Gastro-esophageal reflux disease without esophagitis: Secondary | ICD-10-CM | POA: Diagnosis not present

## 2023-09-16 DIAGNOSIS — K420 Umbilical hernia with obstruction, without gangrene: Secondary | ICD-10-CM | POA: Diagnosis not present

## 2023-09-16 DIAGNOSIS — R109 Unspecified abdominal pain: Secondary | ICD-10-CM | POA: Diagnosis not present

## 2023-09-16 DIAGNOSIS — R739 Hyperglycemia, unspecified: Secondary | ICD-10-CM | POA: Diagnosis not present

## 2023-09-16 DIAGNOSIS — R7401 Elevation of levels of liver transaminase levels: Secondary | ICD-10-CM | POA: Diagnosis not present

## 2023-09-18 ENCOUNTER — Telehealth: Payer: Self-pay

## 2023-09-18 NOTE — Transitions of Care (Post Inpatient/ED Visit) (Signed)
   09/18/2023  Name: Derrick Benitez MRN: [id number] DOB: 03/30/1981  Today's TOC FU Call Status: Today's TOC FU Call Status:: Unsuccessful Call (1st Attempt) Unsuccessful Call (1st Attempt) Date: 09/18/23  Attempted to reach the patient regarding the most recent Inpatient/ED visit.  Follow Up Plan: Additional outreach attempts will be made to reach the patient to complete the Transitions of Care (Post Inpatient/ED visit) call.   Signature  Germain Kohler, CMA (AAMA)  [facility] Program [phone]

## 2023-10-29 DIAGNOSIS — R21 Rash and other nonspecific skin eruption: Secondary | ICD-10-CM | POA: Diagnosis not present

## 2023-10-29 DIAGNOSIS — T7840XA Allergy, unspecified, initial encounter: Secondary | ICD-10-CM | POA: Diagnosis not present

## 2023-10-29 DIAGNOSIS — X58XXXA Exposure to other specified factors, initial encounter: Secondary | ICD-10-CM | POA: Diagnosis not present

## 2023-11-07 DIAGNOSIS — R070 Pain in throat: Secondary | ICD-10-CM | POA: Diagnosis not present

## 2023-11-13 ENCOUNTER — Telehealth: Payer: Self-pay

## 2023-11-13 DIAGNOSIS — E1165 Type 2 diabetes mellitus with hyperglycemia: Secondary | ICD-10-CM | POA: Diagnosis not present

## 2023-11-13 DIAGNOSIS — R519 Headache, unspecified: Secondary | ICD-10-CM | POA: Diagnosis not present

## 2023-11-13 DIAGNOSIS — K219 Gastro-esophageal reflux disease without esophagitis: Secondary | ICD-10-CM | POA: Diagnosis not present

## 2023-11-13 NOTE — Telephone Encounter (Signed)
 Copied from CRM [id number]. Topic: Clinical - Request for Lab/Test Order >> Nov 13, 2023  7:59 AM Mesmerise C wrote: Reason for CRM: Patient stated he'll need blood work labs done that he doesn't know if the last labs will be good for his next appointment

## 2023-11-13 NOTE — Telephone Encounter (Signed)
 Unable to reach patient. LMTRC

## 2023-11-16 ENCOUNTER — Ambulatory Visit: Admitting: Internal Medicine

## 2023-11-16 ENCOUNTER — Encounter: Payer: Self-pay | Admitting: Internal Medicine

## 2023-11-16 VITALS — BP 136/84 | HR 72 | Temp 98.2°F | Ht 70.0 in | Wt 213.0 lb

## 2023-11-16 DIAGNOSIS — E119 Type 2 diabetes mellitus without complications: Secondary | ICD-10-CM

## 2023-11-16 DIAGNOSIS — Z794 Long term (current) use of insulin: Secondary | ICD-10-CM | POA: Diagnosis not present

## 2023-11-16 DIAGNOSIS — E785 Hyperlipidemia, unspecified: Secondary | ICD-10-CM

## 2023-11-16 DIAGNOSIS — Z7984 Long term (current) use of oral hypoglycemic drugs: Secondary | ICD-10-CM | POA: Diagnosis not present

## 2023-11-16 DIAGNOSIS — K580 Irritable bowel syndrome with diarrhea: Secondary | ICD-10-CM

## 2023-11-16 DIAGNOSIS — I889 Nonspecific lymphadenitis, unspecified: Secondary | ICD-10-CM | POA: Diagnosis not present

## 2023-11-16 LAB — CBC WITH DIFFERENTIAL/PLATELET
Basophils Absolute: 0.1 K/uL (ref 0.0–0.1)
Basophils Relative: 1 % (ref 0.0–3.0)
Eosinophils Absolute: 0.2 K/uL (ref 0.0–0.7)
Eosinophils Relative: 2.7 % (ref 0.0–5.0)
HCT: 41.6 % (ref 39.0–52.0)
Hemoglobin: 14.3 g/dL (ref 13.0–17.0)
Lymphocytes Relative: 35.4 % (ref 12.0–46.0)
Lymphs Abs: 2 K/uL (ref 0.7–4.0)
MCHC: 34.3 g/dL (ref 30.0–36.0)
MCV: 86.5 fl (ref 78.0–100.0)
Monocytes Absolute: 0.7 K/uL (ref 0.1–1.0)
Monocytes Relative: 13.1 % — ABNORMAL HIGH (ref 3.0–12.0)
Neutro Abs: 2.7 K/uL (ref 1.4–7.7)
Neutrophils Relative %: 47.8 % (ref 43.0–77.0)
Platelets: 266 K/uL (ref 150.0–400.0)
RBC: 4.81 Mil/uL (ref 4.22–5.81)
RDW: 12.7 % (ref 11.5–15.5)
WBC: 5.7 K/uL (ref 4.0–10.5)

## 2023-11-16 LAB — POCT GLYCOSYLATED HEMOGLOBIN (HGB A1C): Hemoglobin A1C: 9.2 % — AB (ref 4.0–5.6)

## 2023-11-16 LAB — TSH: TSH: 1.14 u[IU]/mL (ref 0.35–5.50)

## 2023-11-16 LAB — POCT CBG (FASTING - GLUCOSE)-MANUAL ENTRY: Glucose Fasting, POC: 277 mg/dL — AB (ref 70–99)

## 2023-11-16 MED ORDER — INSULIN PEN NEEDLE 32G X 6 MM MISC: 1.0000 | Status: DC

## 2023-11-16 MED ORDER — AMOXICILLIN-POT CLAVULANATE 875-125 MG PO TABS: 1.0000 | ORAL_TABLET | Freq: Two times a day (BID) | ORAL | 0 refills | Status: AC

## 2023-11-16 MED ORDER — METFORMIN HCL ER 750 MG PO TB24: 750.0000 mg | ORAL_TABLET | Freq: Every day | ORAL | 0 refills | Status: DC

## 2023-11-16 MED ORDER — VIBERZI 75 MG PO TABS
1.0000 | ORAL_TABLET | Freq: Two times a day (BID) | ORAL | 0 refills | Status: AC | PRN
Start: 2023-11-16 — End: ?

## 2023-11-16 MED ORDER — TOUJEO MAX SOLOSTAR 300 UNIT/ML ~~LOC~~ SOPN: 20.0000 [IU] | PEN_INJECTOR | Freq: Every day | SUBCUTANEOUS | 0 refills | Status: DC

## 2023-11-16 MED ORDER — FREESTYLE LIBRE 3 PLUS SENSOR MISC: 1.0000 | 1 refills | Status: DC

## 2023-11-16 NOTE — Patient Instructions (Signed)
 Lymphangitis, Adult  Lymphangitis is inflammation of one or more lymph vessels. It is often caused by an infection with bacteria. Lymph vessels are part of the lymphatic system. This system is part of the body's defense system (immune system). It is a network of vessels, glands, and organs that carry a fluid called lymph around your body. Lymph vessels drain into glands called lymph nodes. These nodes take bacteria, viruses, and waste products out of the lymph to keep them from spreading through the body. Lymphangitis causes red streaks and swelling. It can also make your skin sore. Prompt treatment is needed to stop it from getting worse. If not treated, it can spread through your lymph system and into your blood (bacteremia). What are the causes? In most cases, this condition is caused by an infection of the skin. Bacteria may enter your body when your skin is injured. This injury may be from a cut, scratch, or insect bite. It may also be from an incision made during surgery. What increases the risk? You may be more likely to get this condition if: You are male. Males are more likely to get this condition from a skin infection called cellulitis. You have a weak immune system. You take medicines that weaken (suppress) your immune system. You have diabetes. You have chickenpox. You are weak from another illness. What are the signs or symptoms? The most common symptom of this condition is a wound or skin infection that causes red streaks in the skin. These red streaks are the infected lymph vessels. They extend toward the lymph nodes. They may be warm and tender. Other symptoms may include: Throbbing pain. Swollen and tender lymph nodes. Fever or chills. Loss of appetite. Muscle aches. Fast pulse. How is this diagnosed? This condition may be diagnosed based on your symptoms and a physical exam. You may also have tests, such as: Blood tests and cultures. These may be done to find out what  bacteria caused the infection. Culture tests on some pus taken from the infected wound or swollen gland. X-rays. These may be needed if you have a red or swollen joint. You may also need to see an expert in dealing with bone problems. How is this treated? Treatment for this condition may include: Antibiotics. These may be given through an IV. Pain medicine. Incision and drainage. This is a procedure to drain pus from a wound or lymph gland. Follow these instructions at home: Activity Rest as told by your health care provider. Raise (elevate) the affected area above the level of your heart while you are sitting or lying down. Return to your normal activities as told by your provider. Ask your provider what activities are safe for you. General instructions Drink enough fluid to keep your pee (urine) pale yellow. Take over-the-counter and prescription medicines as told by your provider. Finish your antibiotics even if you start to feel better. Follow instructions from your provider about how to take care of any wound. Keep all follow-up visits. Your provider will watch you closely to make sure treatment is working. Contact a health care provider if: You have chills or a fever. Your symptoms do not go away or get worse with treatment. Your symptoms come back after treatment. You have a headache or a stiff neck. Get help right away if: You have chest pain. You have trouble breathing. These symptoms may be an emergency. Get help right away. Call 911. Do not wait to see if the symptoms will go away. Do not  drive yourself to the hospital. This information is not intended to replace advice given to you by your health care provider. Make sure you discuss any questions you have with your health care provider. Document Revised: 12/15/2021 Document Reviewed: 12/15/2021 Elsevier Patient Education  2024 [employer].

## 2023-11-16 NOTE — Progress Notes (Unsigned)
 Subjective:  Patient ID: Derrick Benitez, male    DOB: 1980/12/22  Age: 43 y.o. MRN: [id number]  CC: Diabetes   HPI Derrick Benitez presents for f/up ----  Discussed the use of AI scribe software for clinical note transcription with the patient, who gave verbal consent to proceed.  History of Present Illness Derrick Benitez is a 43 year old male who presents with hyperglycemia and swollen lymph nodes.  Three days ago, he experienced a blood sugar level above 400 mg/dL, prompting a visit to the emergency room. He felt shaky and sore all over, with particular soreness in the neck area. He also noted excessive thirst and urination, and difficulty sleeping due to waking up with a dry mouth.  In the emergency room, blood work was performed, and he received intravenous treatment to lower his blood sugar. Since then, he has been monitoring his blood sugar levels, which were 424 mg/dL at the ER and have remained concerning. He continues to experience chills, some nausea, and soreness throughout his body, with the worst soreness in the neck area.  He reports soreness and swelling in the left side of his neck, which started about five days ago, before his ER visit. He has not been taking any antibiotics for this issue. He reports night sweats and a significant weight loss from 235 to 208 pounds.  He reports excessive thirst, excessive urination, chills, some nausea, and night sweats. No vomiting. He experiences soreness all over, with the worst soreness in the neck area, and difficulty sleeping due to a dry mouth.    Outpatient Medications Prior to Visit  Medication Sig Dispense Refill   Accu-Chek Softclix Lancets lancets 1 each by Other route 2 (two) times daily. Use as instructed 100 each 2   Blood Glucose Monitoring Suppl (ACCU-CHEK GUIDE ME) w/Device KIT 1 Act by Does not apply route 2 (two) times daily. 1 kit 2   Cholecalciferol (VITAMIN D3) 50 MCG (2000 UT) capsule Take 1 capsule (2,000 Units total) by  mouth daily. 100 capsule 3   DESCOVY  200-25 MG tablet TAKE 1 TABLET BY MOUTH DAILY 90 tablet 0   EPINEPHrine 0.3 mg/0.3 mL IJ SOAJ injection Inject 0.3 mg into the muscle as needed for anaphylaxis.     glucose blood (ACCU-CHEK GUIDE) test strip 1 each by Other route 2 (two) times daily. Use as instructed 100 each 2   rosuvastatin  (CRESTOR ) 5 MG tablet TAKE 1 TABLET(5 MG) BY MOUTH DAILY 90 tablet 1   sildenafil  (VIAGRA ) 100 MG tablet Take 0.5-1 tablets (50-100 mg total) by mouth daily as needed for erectile dysfunction. 12 tablet 1   Vitamin D , Ergocalciferol , (DRISDOL ) 1.25 MG (50000 UNIT) CAPS capsule Take 1 capsule (50,000 Units total) by mouth every 7 (seven) days. 8 capsule 0   vortioxetine  HBr (TRINTELLIX ) 10 MG TABS tablet Take 1 tablet (10 mg total) by mouth daily. 90 tablet 0   Eluxadoline  (VIBERZI ) 75 MG TABS Take 1 tablet (75 mg total) by mouth 2 (two) times daily as needed. 180 tablet 0   No facility-administered medications prior to visit.    ROS Review of Systems  Constitutional:  Positive for chills.  HENT:  Positive for trouble swallowing. Negative for sore throat.   Musculoskeletal:  Positive for neck pain.  Skin:  Negative for rash.  Hematological:  Positive for adenopathy. Does not bruise/bleed easily.  Psychiatric/Behavioral: Negative.      Objective:  BP 136/84 (BP Location: Left Arm, Patient Position: Sitting, Cuff Size: Normal)  Pulse 72   Temp 98.2 F (36.8 C) (Oral)   Ht 5' 10 (1.778 m)   Wt 213 lb (96.6 kg)   SpO2 96%   BMI 30.56 kg/m   BP Readings from Last 3 Encounters:  11/16/23 136/84  08/14/23 126/74  02/14/23 130/82    Wt Readings from Last 3 Encounters:  11/16/23 213 lb (96.6 kg)  08/14/23 220 lb 9.6 oz (100.1 kg)  02/14/23 226 lb 9.6 oz (102.8 kg)    Physical Exam HENT:     Head:     Salivary Glands: Right salivary gland is not diffusely enlarged or tender. Left salivary gland is not diffusely enlarged or tender.     Mouth/Throat:      Lips: Pink. No lesions.     Mouth: Mucous membranes are moist. No oral lesions.     Palate: No mass.     Pharynx: No pharyngeal swelling, oropharyngeal exudate, posterior oropharyngeal erythema, uvula swelling or postnasal drip.     Tonsils: No tonsillar exudate or tonsillar abscesses.  Neck:     Thyroid : No thyroid  mass, thyromegaly or thyroid  tenderness.     Comments: Left anterior cervical lymph node is enlarged and tender Musculoskeletal:     Cervical back: Normal range of motion.  Lymphadenopathy:     Cervical: Cervical adenopathy present.     Right cervical: No superficial, deep or posterior cervical adenopathy.    Left cervical: Superficial cervical adenopathy present. No deep or posterior cervical adenopathy.     Lab Results  Component Value Date   WBC 6.7 02/14/2023   HGB 14.5 02/14/2023   HCT 44.2 02/14/2023   PLT 250.0 02/14/2023   GLUCOSE 170 (H) 08/14/2023   CHOL 91 08/14/2023   TRIG 130.0 08/14/2023   HDL 32.20 (L) 08/14/2023   LDLDIRECT 92.0 06/28/2022   LDLCALC 33 08/14/2023   ALT 32 02/14/2023   AST 20 02/14/2023   NA 136 08/14/2023   K 3.7 08/14/2023   CL 100 08/14/2023   CREATININE 1.01 08/14/2023   BUN 19 08/14/2023   CO2 30 08/14/2023   TSH 1.27 05/17/2022   HGBA1C 9.2 (A) 11/16/2023   MICROALBUR 1.4 08/14/2023    MR CERVICAL SPINE WO CONTRAST Result Date: 06/10/2020  [facility] [address], [address] [place], [place] [address] [phone] NEUROIMAGING REPORT STUDY DATE: 06/10/2020 PATIENT NAME: Derrick Benitez DOB: 1981/02/15 MRN: [id number] EXAM: MRI of the cervical spine ORDERING CLINICIAN: Richard A. Sater, MD. PhD CLINICAL HISTORY: 43 year old man with an abnormal brain MRI, dysesthesias and neck pain COMPARISON FILMS: None TECHNIQUE: MRI of the cervical spine was obtained utilizing 3 mm sagittal slices from the posterior fossa down to the T3-4 level with T1, T2 and inversion recovery views. In addition 4 mm axial slices from C2-3  down to T1-2 level were included with T2 and gradient echo views. CONTRAST: None IMAGING SITE: [facility], [address] [place], [place], [place] FINDINGS: :  On sagittal images, the spine is imaged from above the cervicomedullary junction to T2.   Visible brain appears normal.  Paravertebral soft tissue appears normal.  The spinal cord is of normal caliber and signal.    Minimal reversal of the cervical curvature..  There is no spondylolisthesis.   The vertebral bodies have normal signal.  The discs and interspaces were further evaluated on axial views from C2 to T1 as follows: C2 - C3:  The disc and interspace appear normal. C3 - C4: There is minimal disc bulging.  There is  no foraminal narrowing or spinal stenosis.  No nerve root compression.  C4 - C5:  There is minimal disc bulging.  There is no foraminal narrowing or spinal stenosis.  No nerve root compression.  C5 - C6:  The disc and interspace appear normal. C6 - C7:  There is minimal disc bulging.  There is no foraminal narrowing or spinal stenosis.  No nerve root compression.  C7 - T1:  The disc and interspace appear normal.   This MRI of the cervical spine without contrast shows the following: 1.   The spinal cord appears normal.  There is no evidence of demyelination. 2.   Minimal disc degenerative changes at C3-C4 and C4-C5 and C6-C7 that does not cause spinal stenosis or nerve root compression. INTERPRETING PHYSICIAN: Richard A. Vear, MD, PhD, FAAN Certified in  Neuroimaging by [employer]    Assessment & Plan:  Cervical lymphadenitis -     Amoxicillin -Pot Clavulanate; Take 1 tablet by mouth 2 (two) times daily for 7 days.  Dispense: 14 tablet; Refill: 0 -     CBC with Differential/Platelet; Future  Irritable bowel syndrome with diarrhea -     Viberzi ; Take 1 tablet (75 mg total) by mouth 2 (two) times daily as needed.  Dispense: 180 tablet; Refill: 0  Type 2 diabetes mellitus without complication, without long-term  current use of insulin  (HCC) -     POCT CBG (Fasting - Glucose) -     POCT glycosylated hemoglobin (Hb A1C) -     Basic metabolic panel with GFR; Future  Insulin -requiring or dependent type II diabetes mellitus (HCC) -     AMB Referral VBCI Care Management -     FreeStyle Libre 3 Plus Sensor; Apply 1 Act topically every 14 (fourteen) days. Change sensor every 15 days.  Dispense: 6 each; Refill: 1 -     metFORMIN  HCl ER; Take 1 tablet (750 mg total) by mouth daily with breakfast.  Dispense: 90 tablet; Refill: 0 -     Toujeo  Max SoloStar; Inject 20 Units into the skin daily.  Dispense: 6 mL; Refill: 0 -     Insulin  Pen Needle; 1 Act by Does not apply route once a week.     Follow-up: Return in about 3 months (around 02/16/2024).  Debby Molt, MD

## 2023-11-17 LAB — BASIC METABOLIC PANEL WITH GFR
BUN: 18 mg/dL (ref 6–23)
CO2: 27 meq/L (ref 19–32)
Calcium: 9 mg/dL (ref 8.4–10.5)
Chloride: 101 meq/L (ref 96–112)
Creatinine, Ser: 0.96 mg/dL (ref 0.40–1.50)
GFR: 96.93 mL/min (ref >60.00)
Glucose, Bld: 269 mg/dL — ABNORMAL HIGH (ref 70–99)
Potassium: 3.9 meq/L (ref 3.5–5.1)
Sodium: 138 meq/L (ref 135–145)

## 2023-11-17 NOTE — Telephone Encounter (Signed)
Patient has already had an appointment.

## 2023-11-20 ENCOUNTER — Other Ambulatory Visit: Payer: Self-pay | Admitting: Internal Medicine

## 2023-11-20 DIAGNOSIS — Z7252 High risk homosexual behavior: Secondary | ICD-10-CM

## 2023-11-21 ENCOUNTER — Telehealth: Payer: Self-pay | Admitting: *Deleted

## 2023-11-21 NOTE — Progress Notes (Signed)
 Care Guide Pharmacy Note  11/21/2023 Name: Derrick Benitez MRN: [id number] DOB: 1981-03-05  Referred By: Joshua Debby CROME, MD Reason for referral: Call Attempt #1 and Complex Care Management (Outreach to schedule referral with pharmacist )   Derrick Benitez is a 43 y.o. year old male who is a primary care patient of Joshua Debby CROME, MD.  Derrick Benitez was referred to the pharmacist for assistance related to: DMII  An unsuccessful telephone outreach was attempted today to contact the patient who was referred to the pharmacy team for assistance with medication management. Additional attempts will be made to contact the patient.  Derrick Benitez, CMA, Care Guide [facility]  [facility], [facility] Guide Direct Dial: [phone]  Fax: [phone] Website: [email or web address]

## 2023-11-22 NOTE — Progress Notes (Signed)
 Care Guide Pharmacy Note  11/22/2023 Name: Chason Mciver MRN: [id number] DOB: 1980/09/13  Referred By: Joshua Debby CROME, MD Reason for referral: Call Attempt #1 and Complex Care Management (Outreach to schedule referral with pharmacist )   Virat Passero is a 43 y.o. year old male who is a primary care patient of Joshua Debby CROME, MD.  Tierra Divelbiss was referred to the pharmacist for assistance related to: DMII  A second unsuccessful telephone outreach was attempted today to contact the patient who was referred to the pharmacy team for assistance with medication management. Additional attempts will be made to contact the patient.  Thedford Franks, CMA [place]  [facility],  [facility] Guide Direct Dial: [phone]  Fax: [phone] Website: [email or web address]

## 2023-11-23 NOTE — Progress Notes (Signed)
 Complex Care Management Note Care Guide Note  11/23/2023 Name: Derrick Benitez MRN: [id number] DOB: 1980/08/22   Complex Care Management Outreach Attempts: A third unsuccessful outreach was attempted today to offer the patient with information about available complex care management services.  Follow Up Plan:  No further outreach attempts will be made at this time. We have been unable to contact the patient to offer or enroll patient in complex care management services.  Encounter Outcome:  No Answer  Thedford Franks, CMA [place]  [facility], [facility] Guide Direct Dial: [phone]  Fax: [phone] Website: [email or web address]

## 2023-11-28 ENCOUNTER — Inpatient Hospital Stay: Admitting: Internal Medicine

## 2023-12-15 ENCOUNTER — Other Ambulatory Visit: Payer: Self-pay | Admitting: Internal Medicine

## 2023-12-15 DIAGNOSIS — E785 Hyperlipidemia, unspecified: Secondary | ICD-10-CM

## 2024-01-10 DIAGNOSIS — R1084 Generalized abdominal pain: Secondary | ICD-10-CM | POA: Diagnosis not present

## 2024-01-10 DIAGNOSIS — K922 Gastrointestinal hemorrhage, unspecified: Secondary | ICD-10-CM | POA: Diagnosis not present

## 2024-01-10 DIAGNOSIS — K921 Melena: Secondary | ICD-10-CM | POA: Diagnosis not present

## 2024-01-10 DIAGNOSIS — K589 Irritable bowel syndrome without diarrhea: Secondary | ICD-10-CM | POA: Diagnosis not present

## 2024-02-06 ENCOUNTER — Other Ambulatory Visit: Payer: Self-pay | Admitting: Internal Medicine

## 2024-02-06 DIAGNOSIS — E119 Type 2 diabetes mellitus without complications: Secondary | ICD-10-CM

## 2024-02-11 ENCOUNTER — Other Ambulatory Visit: Payer: Self-pay | Admitting: Internal Medicine

## 2024-02-11 DIAGNOSIS — E119 Type 2 diabetes mellitus without complications: Secondary | ICD-10-CM

## 2024-02-19 ENCOUNTER — Ambulatory Visit (INDEPENDENT_AMBULATORY_CARE_PROVIDER_SITE_OTHER): Admitting: Internal Medicine

## 2024-02-19 ENCOUNTER — Encounter: Payer: Self-pay | Admitting: Internal Medicine

## 2024-02-19 ENCOUNTER — Ambulatory Visit: Payer: Self-pay | Admitting: Internal Medicine

## 2024-02-19 VITALS — BP 124/76 | HR 60 | Temp 98.5°F | Ht 70.0 in | Wt 207.0 lb

## 2024-02-19 DIAGNOSIS — A5149 Other secondary syphilitic conditions: Secondary | ICD-10-CM | POA: Diagnosis not present

## 2024-02-19 DIAGNOSIS — E119 Type 2 diabetes mellitus without complications: Secondary | ICD-10-CM | POA: Diagnosis not present

## 2024-02-19 DIAGNOSIS — I10 Essential (primary) hypertension: Secondary | ICD-10-CM

## 2024-02-19 DIAGNOSIS — Z7252 High risk homosexual behavior: Secondary | ICD-10-CM | POA: Diagnosis not present

## 2024-02-19 DIAGNOSIS — A510 Primary genital syphilis: Secondary | ICD-10-CM

## 2024-02-19 DIAGNOSIS — B002 Herpesviral gingivostomatitis and pharyngotonsillitis: Secondary | ICD-10-CM

## 2024-02-19 LAB — BASIC METABOLIC PANEL WITH GFR
BUN: 23 mg/dL (ref 6–23)
CO2: 28 meq/L (ref 19–32)
Calcium: 8.8 mg/dL (ref 8.4–10.5)
Chloride: 104 meq/L (ref 96–112)
Creatinine, Ser: 0.9 mg/dL (ref 0.40–1.50)
GFR: 104.55 mL/min (ref >60.00)
Glucose, Bld: 144 mg/dL — ABNORMAL HIGH (ref 70–99)
Potassium: 3.8 meq/L (ref 3.5–5.1)
Sodium: 140 meq/L (ref 135–145)

## 2024-02-19 LAB — CBC WITH DIFFERENTIAL/PLATELET
Basophils Absolute: 0 K/uL (ref 0.0–0.1)
Basophils Relative: 0.9 % (ref 0.0–3.0)
Eosinophils Absolute: 0.1 K/uL (ref 0.0–0.7)
Eosinophils Relative: 1.5 % (ref 0.0–5.0)
HCT: 41.3 % (ref 39.0–52.0)
Hemoglobin: 14.2 g/dL (ref 13.0–17.0)
Lymphocytes Relative: 34.8 % (ref 12.0–46.0)
Lymphs Abs: 1.7 K/uL (ref 0.7–4.0)
MCHC: 34.3 g/dL (ref 30.0–36.0)
MCV: 91.2 fl (ref 78.0–100.0)
Monocytes Absolute: 0.4 K/uL (ref 0.1–1.0)
Monocytes Relative: 9 % (ref 3.0–12.0)
Neutro Abs: 2.6 K/uL (ref 1.4–7.7)
Neutrophils Relative %: 53.8 % (ref 43.0–77.0)
Platelets: 222 K/uL (ref 150.0–400.0)
RBC: 4.53 Mil/uL (ref 4.22–5.81)
RDW: 13.4 % (ref 11.5–15.5)
WBC: 4.8 K/uL (ref 4.0–10.5)

## 2024-02-19 LAB — HEMOGLOBIN A1C: Hgb A1c MFr Bld: 4.7 % (ref 4.6–6.5)

## 2024-02-19 MED ORDER — INSULIN PEN NEEDLE 32G X 6 MM MISC: 1.0000 | Freq: Every day | 3 refills | Status: AC

## 2024-02-19 MED ORDER — PREDNISOLONE 15 MG/5ML PO SOLN
15.0000 mg | Freq: Every day | ORAL | 0 refills | Status: AC
Start: 2024-02-19 — End: ?

## 2024-02-19 NOTE — Patient Instructions (Signed)
 Stomatitis Stomatitis is a condition that causes irritation and swelling (inflammation) in the mouth. It can affect all or part of the inside of the mouth. The condition often affects the cheek, teeth, gums, lips, and tongue. It can also affect the tissues that produce mucus in the mouth (mucosa). Pain from stomatitis can make it hard for you to eat or drink. Very bad cases of this condition can lead to: Not getting enough fluid in your body (dehydration). Poor nutrition. What are the causes? Infections caused by germs (viruses, bacteria, or fungi). Examples include cold sores, shingles, and oral thrush. Cancer treatment, including chemotherapy and radiation therapy. Not getting enough of certain vitamins (vitamin deficiency). Not getting proper nutrition. Injury to the mouth. This can be from: Dentures or braces that do not fit well. Biting your tongue or cheek. Burning your mouth. Having sharp or broken teeth. Gum disease. Using tobacco, especially chewing tobacco. Allergies to foods, medicines, or substances that are used in your mouth. Certain medicines. In some cases, the cause may not be known. What increases the risk? Not taking good care of your mouth and teeth (poor oral hygiene). Having poor nutrition. Having any condition that weakens the body's defense system (immune system). Being treated for cancer. Using tobacco products. Having any condition that causes a dry mouth. Being under a lot of physical or emotional stress. What are the signs or symptoms? The most common symptoms of this condition are pain, swelling, and redness inside your mouth. The pain may feel like burning or stinging. It may get worse from eating or drinking.  Other symptoms may include: Painful, shallow sores (ulcers) in the mouth. White patches in the mouth. Blisters in the mouth. Bleeding gums. Bad breath. Bad taste in the mouth. Fever. How is this treated? Treatment depends on the cause.  Treatment may include: Over-the-counter pain medicines or medicines to coat or numb your mouth. Gel, cream, or spray to numb the area (topical anesthetic) if you have very bad pain. Medicines to treat an infection. Vitamins. Mouth rinses to reduce swelling (steroids). You may also need to stop or change any medicines that might be causing the condition. Follow these instructions at home: Medicines Take over-the-counter and prescription medicines only as told by your doctor. If you were prescribed an antibiotic medicine, take it as told by your doctor. Do not stop taking it even if you start to feel better. Do not use products that have benzocaine in them to treat a child younger than 2 years. This includes gels for teething or mouth pain. Ask your doctor if you should avoid driving or using machines while you are taking your medicine. Eating and drinking Eat a balanced diet. Do not eat: Spicy foods. Citrus, such as oranges. Foods that have sharp edges, such as chips. Do not eat any foods that you think may be causing this condition. Do not drink alcohol. Drink enough fluid to keep your pee (urine) pale yellow. This will keep you hydrated. Lifestyle     Take good care of your mouth and teeth: Gently brush your teeth with a soft toothbrush. Do this 2 times each day. Floss your teeth every day. Have your teeth cleaned regularly. Do this as told by your dentist. If you have dentures, make sure that they fit the way that they should. Clean them often as told by your dentist. Do not smoke or use any products that contain nicotine or tobacco. If you need help quitting, ask your doctor. Find ways to  lower your stress. Try yoga or meditation. Ask your doctor for other ideas. General instructions Rinse your mouth often with salt water. To make salt water, dissolve -1 tsp (3-6 g) of salt in 1 cup (237 mL) of warm water. Keep all follow-up visits. Contact a doctor if: Your symptoms get  worse. You have new symptoms, like: A rash. New symptoms that do not involve your mouth area. Your symptoms last longer than 3 weeks. Your symptoms go away and then come back. You feel very tired (fatigued). You feel weaker. You do not want to eat as much as normal (loss of appetite). You feel like you may vomit (nauseous). Get help right away if: You have a fever. You are not able to eat or drink. You have a lot of bleeding in your mouth. Summary Stomatitis is a condition that causes irritation and swelling in the mouth. Pain from stomatitis can make it hard for you to eat or drink. Very bad cases of this condition can lead to not getting enough fluid in your body or poor nutrition. Take medicines only as told by your doctor. Follow instructions from your doctor on diet and lifestyle changes to help manage your symptoms. This information is not intended to replace advice given to you by your health care provider. Make sure you discuss any questions you have with your health care provider. Document Revised: 01/07/2021 Document Reviewed: 01/07/2021 Elsevier Patient Education  2024 [employer].

## 2024-02-19 NOTE — Progress Notes (Signed)
 "  Subjective:  Patient ID: Derrick Benitez, male    DOB: [birth year]  Age: 43 y.o. MRN: [id number]  CC: Medical Management of Chronic Issues (3 month follow up )   HPI Derrick Benitez presents for f/up ----  Discussed the use of AI scribe software for clinical note transcription with the patient, who gave verbal consent to proceed.  History of Present Illness Revis Derrick Benitez is a 43 year old male who presents with fluctuating blood sugar levels and a sore mouth.  He experiences fluctuating blood sugar levels, with highs reaching up to 192 mg/dL and lows around 65 to 70 mg/dL. He attributes these fluctuations to dietary intake and feels worse when his blood sugar is high, experiencing weakness. He manages low blood sugar episodes by consuming sweets or desserts.  He has intermittent diarrhea, occurring every few days, and describes his bowel movements as irregular. He sometimes feels constipated, taking up to thirty minutes to have a bowel movement. His last bowel movement was the day before the visit, with no blood or mucus in the stool.  He has been taking PrEP for HIV prevention and reports no symptoms of infections except for a sore area on the roof of his mouth, present for three days. The area is red and causes difficulty eating due to soreness in the soft palate. He has not taken any medication for this pain.  He received a flu shot last Thursday at [facility] and reports no other symptoms such as night sweats or swollen lymph nodes. No night sweats, swollen lymph nodes, or infections apart from the sore mouth.   Outpatient Medications Prior to Visit  Medication Sig Dispense Refill   Accu-Chek Softclix Lancets lancets 1 each by Other route 2 (two) times daily. Use as instructed 100 each 2   Blood Glucose Monitoring Suppl (ACCU-CHEK GUIDE ME) w/Device KIT 1 Act by Does not apply route 2 (two) times daily. 1 kit 2   Cholecalciferol (VITAMIN D3) 50 MCG (2000 UT) capsule Take 1 capsule (2,000 Units  total) by mouth daily. 100 capsule 3   Continuous Glucose Sensor (FREESTYLE LIBRE 3 PLUS SENSOR) MISC Apply 1 Act topically every 14 (fourteen) days. Change sensor every 15 days. 6 each 1   Eluxadoline  (VIBERZI ) 75 MG TABS Take 1 tablet (75 mg total) by mouth 2 (two) times daily as needed. 180 tablet 0   EPINEPHrine 0.3 mg/0.3 mL IJ SOAJ injection Inject 0.3 mg into the muscle as needed for anaphylaxis.     glucose blood (ACCU-CHEK GUIDE) test strip 1 each by Other route 2 (two) times daily. Use as instructed 100 each 2   metFORMIN  (GLUCOPHAGE -XR) 750 MG 24 hr tablet TAKE 1 TABLET(750 MG) BY MOUTH DAILY WITH BREAKFAST 90 tablet 0   rosuvastatin  (CRESTOR ) 5 MG tablet TAKE 1 TABLET(5 MG) BY MOUTH DAILY 90 tablet 1   sildenafil  (VIAGRA ) 100 MG tablet Take 0.5-1 tablets (50-100 mg total) by mouth daily as needed for erectile dysfunction. 12 tablet 1   TOUJEO  MAX SOLOSTAR 300 UNIT/ML Solostar Pen ADMINISTER 20 UNITS UNDER THE SKIN DAILY 6 mL 0   Vitamin D , Ergocalciferol , (DRISDOL ) 1.25 MG (50000 UNIT) CAPS capsule Take 1 capsule (50,000 Units total) by mouth every 7 (seven) days. 8 capsule 0   vortioxetine  HBr (TRINTELLIX ) 10 MG TABS tablet Take 1 tablet (10 mg total) by mouth daily. 90 tablet 0   DESCOVY  200-25 MG tablet TAKE 1 TABLET BY MOUTH DAILY 90 tablet 0   Insulin  Pen Needle 32G  X 6 MM MISC 1 Act by Does not apply route once a week.     No facility-administered medications prior to visit.    ROS Review of Systems  Constitutional: Negative.   HENT:  Positive for mouth sores. Negative for facial swelling, rhinorrhea, sore throat, trouble swallowing and voice change.   Eyes: Negative.   Respiratory: Negative.  Negative for cough, chest tightness and wheezing.   Cardiovascular:  Negative for chest pain, palpitations and leg swelling.  Gastrointestinal:  Positive for constipation and diarrhea. Negative for abdominal pain, blood in stool, nausea and vomiting.  Endocrine: Negative.    Genitourinary:  Negative for difficulty urinating, dysuria, hematuria and penile discharge.  Musculoskeletal: Negative.   Skin: Negative.  Negative for color change and rash.  Neurological: Negative.  Negative for dizziness and weakness.  Hematological:  Negative for adenopathy. Does not bruise/bleed easily.  Psychiatric/Behavioral: Negative.      Objective:  BP 124/76 (BP Location: Left Arm, Patient Position: Sitting, Cuff Size: Normal)   Pulse 60   Temp 98.5 F (36.9 C) (Oral)   Ht 5' 10 (1.778 m)   Wt 207 lb (93.9 kg)   SpO2 95%   BMI 29.70 kg/m   BP Readings from Last 3 Encounters:  02/19/24 124/76  11/16/23 136/84  08/14/23 126/74    Wt Readings from Last 3 Encounters:  02/19/24 207 lb (93.9 kg)  11/16/23 213 lb (96.6 kg)  08/14/23 220 lb 9.6 oz (100.1 kg)    Physical Exam Vitals reviewed.  HENT:     Mouth/Throat:     Lips: Pink.     Mouth: Mucous membranes are moist. Oral lesions present.     Tongue: No lesions.     Palate: No mass.     Pharynx: Oropharynx is clear. No pharyngeal swelling, oropharyngeal exudate, posterior oropharyngeal erythema, uvula swelling or postnasal drip.     Tonsils: No tonsillar exudate or tonsillar abscesses.      Comments: Over the soft palate there are a group of vesicles surrounded by erythema.  Eyes:     General: No scleral icterus.    Conjunctiva/sclera: Conjunctivae normal.  Cardiovascular:     Rate and Rhythm: Normal rate and regular rhythm.     Pulses: Normal pulses.     Heart sounds: No murmur heard.    No friction rub. No gallop.  Pulmonary:     Effort: Pulmonary effort is normal.     Breath sounds: No stridor. No wheezing, rhonchi or rales.  Abdominal:     General: Abdomen is flat.     Palpations: There is no mass.     Tenderness: There is no abdominal tenderness. There is no guarding.     Hernia: No hernia is present.  Musculoskeletal:        General: Normal range of motion.     Cervical back: Neck supple.      Right lower leg: No edema.     Left lower leg: No edema.  Lymphadenopathy:     Cervical: No cervical adenopathy.  Skin:    General: Skin is warm and dry.     Findings: No rash.  Neurological:     General: No focal deficit present.  Psychiatric:        Mood and Affect: Mood normal.        Behavior: Behavior normal.     Lab Results  Component Value Date   WBC 4.8 02/19/2024   HGB 14.2 02/19/2024   HCT 41.3 02/19/2024  PLT 222.0 02/19/2024   GLUCOSE 144 (H) 02/19/2024   CHOL 91 08/14/2023   TRIG 130.0 08/14/2023   HDL 32.20 (L) 08/14/2023   LDLDIRECT 92.0 06/28/2022   LDLCALC 33 08/14/2023   ALT 32 02/14/2023   AST 20 02/14/2023   NA 140 02/19/2024   K 3.8 02/19/2024   CL 104 02/19/2024   CREATININE 0.90 02/19/2024   BUN 23 02/19/2024   CO2 28 02/19/2024   TSH 1.14 11/16/2023   HGBA1C 4.7 02/19/2024   MICROALBUR 1.4 08/14/2023    MR CERVICAL SPINE WO CONTRAST Result Date: 06/10/2020  [facility] [address], [address] [place], [place] [address] [phone] NEUROIMAGING REPORT STUDY DATE: 06/10/2020 PATIENT NAME: Petro Talent DOB: 03-Sep-1980 MRN: [id number] EXAM: MRI of the cervical spine ORDERING CLINICIAN: Richard A. Sater, MD. PhD CLINICAL HISTORY: 43 year old man with an abnormal brain MRI, dysesthesias and neck pain COMPARISON FILMS: None TECHNIQUE: MRI of the cervical spine was obtained utilizing 3 mm sagittal slices from the posterior fossa down to the T3-4 level with T1, T2 and inversion recovery views. In addition 4 mm axial slices from C2-3 down to T1-2 level were included with T2 and gradient echo views. CONTRAST: None IMAGING SITE: [facility], [address] [place], [place], [place] FINDINGS: :  On sagittal images, the spine is imaged from above the cervicomedullary junction to T2.   Visible brain appears normal.  Paravertebral soft tissue appears normal.  The spinal cord is of normal caliber and signal.    Minimal reversal of the cervical curvature..   There is no spondylolisthesis.   The vertebral bodies have normal signal.  The discs and interspaces were further evaluated on axial views from C2 to T1 as follows: C2 - C3:  The disc and interspace appear normal. C3 - C4: There is minimal disc bulging.  There is no foraminal narrowing or spinal stenosis.  No nerve root compression.  C4 - C5:  There is minimal disc bulging.  There is no foraminal narrowing or spinal stenosis.  No nerve root compression.  C5 - C6:  The disc and interspace appear normal. C6 - C7:  There is minimal disc bulging.  There is no foraminal narrowing or spinal stenosis.  No nerve root compression.  C7 - T1:  The disc and interspace appear normal.   This MRI of the cervical spine without contrast shows the following: 1.   The spinal cord appears normal.  There is no evidence of demyelination. 2.   Minimal disc degenerative changes at C3-C4 and C4-C5 and C6-C7 that does not cause spinal stenosis or nerve root compression. INTERPRETING PHYSICIAN: Richard A. Vear, MD, PhD, FAAN Certified in  Neuroimaging by [employer]    Assessment & Plan:  Herpes gingivostomatitis -     prednisoLONE ; Take 5 mLs (15 mg total) by mouth daily before breakfast. Swish and spit  Dispense: 60 mL; Refill: 0  Insulin -requiring or dependent type II diabetes mellitus (HCC)- A1C is down to 4.7%. Will discontinue the basal insulin . -     Insulin  Pen Needle; 1 Act by Does not apply route daily.  Dispense: 100 each; Refill: 3  Primary hypertension- BP is well controlled. -     Basic metabolic panel with GFR; Future -     CBC with Differential/Platelet; Future  High risk homosexual behavior -     Descovy ; Take 1 tablet by mouth daily.  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  Other orders -     Rpr titer -     T PALLIDUM AB     Follow-up: Return in about 3 months (around 05/21/2024).  Debby Molt, MD "

## 2024-02-22 LAB — RPR TITER: RPR Titer: 1:64 {titer} — ABNORMAL HIGH

## 2024-02-22 LAB — RPR: RPR Ser Ql: REACTIVE — AB

## 2024-02-22 LAB — T PALLIDUM AB: T Pallidum Abs: POSITIVE — AB

## 2024-02-22 LAB — HIV ANTIBODY (ROUTINE TESTING W REFLEX)
HIV 1&2 Ab, 4th Generation: NONREACTIVE
HIV FINAL INTERPRETATION: NEGATIVE

## 2024-02-23 ENCOUNTER — Ambulatory Visit

## 2024-02-23 DIAGNOSIS — A539 Syphilis, unspecified: Secondary | ICD-10-CM

## 2024-02-23 DIAGNOSIS — A5149 Other secondary syphilitic conditions: Secondary | ICD-10-CM | POA: Insufficient documentation

## 2024-02-23 MED ORDER — DESCOVY 200-25 MG PO TABS: 1.0000 | ORAL_TABLET | Freq: Every day | ORAL | 0 refills | Status: AC

## 2024-02-23 MED ORDER — PENICILLIN G BENZATHINE 1200000 UNIT/2ML IM SUSY
1.2000 10*6.[IU] | PREFILLED_SYRINGE | Freq: Once | INTRAMUSCULAR | Status: AC
  Administered 2024-02-23: 1.2 10*6.[IU] via INTRAMUSCULAR

## 2024-02-23 NOTE — Progress Notes (Addendum)
 Patient presents in office for Penicillin  injection for positive Syphillis lab per Dr. Joshua. Patient tolerated injection well, no concerns or questions. I used interpretor ID [id number] to communicate with patient.

## 2024-02-26 ENCOUNTER — Other Ambulatory Visit: Payer: Self-pay | Admitting: Internal Medicine

## 2024-02-26 DIAGNOSIS — Z7252 High risk homosexual behavior: Secondary | ICD-10-CM

## 2024-03-12 DIAGNOSIS — R300 Dysuria: Secondary | ICD-10-CM | POA: Diagnosis not present

## 2024-03-12 DIAGNOSIS — R1030 Lower abdominal pain, unspecified: Secondary | ICD-10-CM | POA: Diagnosis not present

## 2024-03-26 DIAGNOSIS — K1379 Other lesions of oral mucosa: Secondary | ICD-10-CM | POA: Diagnosis not present

## 2024-03-26 DIAGNOSIS — K1370 Unspecified lesions of oral mucosa: Secondary | ICD-10-CM | POA: Diagnosis not present

## 2024-03-26 DIAGNOSIS — K0889 Other specified disorders of teeth and supporting structures: Secondary | ICD-10-CM | POA: Diagnosis not present

## 2024-03-27 ENCOUNTER — Ambulatory Visit: Payer: Self-pay

## 2024-03-27 NOTE — Telephone Encounter (Signed)
 FYI Only or Action Required?: FYI only for provider: Health Department calling to verify results and treatment.  Patient was last seen in primary care on 02/19/2024 by Joshua Debby CROME, MD.  Called Nurse Triage reporting Advice Only.  Symptoms began NA.  Interventions attempted: Other: NA.  Symptoms are: NA.  Triage Disposition: Call PCP Within 24 Hours  Patient/caregiver understands and will follow disposition?: Yes    Reason for Disposition  Caller requesting lab results  (Exception: Routine or non-urgent lab result.)  Answer Assessment - Initial Assessment Questions 1. REASON FOR CALL or QUESTION: What is your reason for calling today? or How can I best     Follow up on positive Syphilis results, confirm treatment received, no additional questions or concerns at this time. 2. CALLER: Document the source of call. (e.g., laboratory staff, caregiver or patient).     Wells Sharps, Disease Specialist with [facility] Department, Health and Human Services  Protocols used: PCP Call - No Triage-A-AH

## 2024-03-28 NOTE — Telephone Encounter (Signed)
 Unable to get in contact with one of the Nurses. Left a message for her to return my call.

## 2024-04-03 ENCOUNTER — Ambulatory Visit: Admitting: Internal Medicine

## 2024-04-03 ENCOUNTER — Encounter: Payer: Self-pay | Admitting: Internal Medicine

## 2024-04-03 VITALS — BP 122/82 | HR 100 | Temp 97.6°F | Resp 16 | Ht 70.0 in | Wt 202.0 lb

## 2024-04-03 DIAGNOSIS — E119 Type 2 diabetes mellitus without complications: Secondary | ICD-10-CM | POA: Diagnosis not present

## 2024-04-03 DIAGNOSIS — Z0000 Encounter for general adult medical examination without abnormal findings: Secondary | ICD-10-CM | POA: Diagnosis not present

## 2024-04-03 DIAGNOSIS — A5149 Other secondary syphilitic conditions: Secondary | ICD-10-CM

## 2024-04-03 DIAGNOSIS — I10 Essential (primary) hypertension: Secondary | ICD-10-CM

## 2024-04-03 DIAGNOSIS — E785 Hyperlipidemia, unspecified: Secondary | ICD-10-CM

## 2024-04-03 DIAGNOSIS — N342 Other urethritis: Secondary | ICD-10-CM | POA: Diagnosis not present

## 2024-04-03 DIAGNOSIS — Z0001 Encounter for general adult medical examination with abnormal findings: Secondary | ICD-10-CM

## 2024-04-03 LAB — URINALYSIS, ROUTINE W REFLEX MICROSCOPIC
Bilirubin Urine: NEGATIVE
Hgb urine dipstick: NEGATIVE
Ketones, ur: NEGATIVE
Leukocytes,Ua: NEGATIVE
Nitrite: NEGATIVE
RBC / HPF: NONE SEEN
Specific Gravity, Urine: 1.025 (ref 1.000–1.030)
Total Protein, Urine: NEGATIVE
Urine Glucose: NEGATIVE
Urobilinogen, UA: 0.2 (ref 0.0–1.0)
WBC, UA: NONE SEEN
pH: 6 (ref 5.0–8.0)

## 2024-04-03 LAB — HEPATIC FUNCTION PANEL
ALT: 19 U/L (ref 3–53)
AST: 14 U/L (ref 5–37)
Albumin: 4.4 g/dL (ref 3.5–5.2)
Alkaline Phosphatase: 56 U/L (ref 39–117)
Bilirubin, Direct: 0.1 mg/dL (ref 0.1–0.3)
Total Bilirubin: 0.5 mg/dL (ref 0.2–1.2)
Total Protein: 7.2 g/dL (ref 6.0–8.3)

## 2024-04-03 LAB — PSA: PSA: 1.75 ng/mL (ref 0.10–4.00)

## 2024-04-03 LAB — HEMOGLOBIN A1C: Hgb A1c MFr Bld: 5.3 % (ref 4.6–6.5)

## 2024-04-03 NOTE — Patient Instructions (Signed)
 Health Maintenance, Male  Adopting a healthy lifestyle and getting preventive care are important in promoting health and wellness. Ask your health care provider about:  The right schedule for you to have regular tests and exams.  Things you can do on your own to prevent diseases and keep yourself healthy.  What should I know about diet, weight, and exercise?  Eat a healthy diet    Eat a diet that includes plenty of vegetables, fruits, low-fat dairy products, and lean protein.  Do not eat a lot of foods that are high in solid fats, added sugars, or sodium.  Maintain a healthy weight  Body mass index (BMI) is a measurement that can be used to identify possible weight problems. It estimates body fat based on height and weight. Your health care provider can help determine your BMI and help you achieve or maintain a healthy weight.  Get regular exercise  Get regular exercise. This is one of the most important things you can do for your health. Most adults should:  Exercise for at least 150 minutes each week. The exercise should increase your heart rate and make you sweat (moderate-intensity exercise).  Do strengthening exercises at least twice a week. This is in addition to the moderate-intensity exercise.  Spend less time sitting. Even light physical activity can be beneficial.  Watch cholesterol and blood lipids  Have your blood tested for lipids and cholesterol at 43 years of age, then have this test every 5 years.  You may need to have your cholesterol levels checked more often if:  Your lipid or cholesterol levels are high.  You are older than 43 years of age.  You are at high risk for heart disease.  What should I know about cancer screening?  Many types of cancers can be detected early and may often be prevented. Depending on your health history and family history, you may need to have cancer screening at various ages. This may include screening for:  Colorectal cancer.  Prostate cancer.  Skin cancer.  Lung  cancer.  What should I know about heart disease, diabetes, and high blood pressure?  Blood pressure and heart disease  High blood pressure causes heart disease and increases the risk of stroke. This is more likely to develop in people who have high blood pressure readings or are overweight.  Talk with your health care provider about your target blood pressure readings.  Have your blood pressure checked:  Every 3-5 years if you are 24-52 years of age.  Every year if you are 3 years old or older.  If you are between the ages of 60 and 72 and are a current or former smoker, ask your health care provider if you should have a one-time screening for abdominal aortic aneurysm (AAA).  Diabetes  Have regular diabetes screenings. This checks your fasting blood sugar level. Have the screening done:  Once every three years after age 66 if you are at a normal weight and have a low risk for diabetes.  More often and at a younger age if you are overweight or have a high risk for diabetes.  What should I know about preventing infection?  Hepatitis B  If you have a higher risk for hepatitis B, you should be screened for this virus. Talk with your health care provider to find out if you are at risk for hepatitis B infection.  Hepatitis C  Blood testing is recommended for:  Everyone born from [birth year] through 1965.  Anyone  with known risk factors for hepatitis C.  Sexually transmitted infections (STIs)  You should be screened each year for STIs, including gonorrhea and chlamydia, if:  You are sexually active and are younger than 43 years of age.  You are older than 43 years of age and your health care provider tells you that you are at risk for this type of infection.  Your sexual activity has changed since you were last screened, and you are at increased risk for chlamydia or gonorrhea. Ask your health care provider if you are at risk.  Ask your health care provider about whether you are at high risk for HIV. Your health care provider  may recommend a prescription medicine to help prevent HIV infection. If you choose to take medicine to prevent HIV, you should first get tested for HIV. You should then be tested every 3 months for as long as you are taking the medicine.  Follow these instructions at home:  Alcohol use  Do not drink alcohol if your health care provider tells you not to drink.  If you drink alcohol:  Limit how much you have to 0-2 drinks a day.  Know how much alcohol is in your drink. In the [place] one drink equals one 12 oz bottle of beer (355 mL), one 5 oz glass of wine (148 mL), or one 1 oz glass of hard liquor (44 mL).  Lifestyle  Do not use any products that contain nicotine or tobacco. These products include cigarettes, chewing tobacco, and vaping devices, such as e-cigarettes. If you need help quitting, ask your health care provider.  Do not use street drugs.  Do not share needles.  Ask your health care provider for help if you need support or information about quitting drugs.  General instructions  Schedule regular health, dental, and eye exams.  Stay current with your vaccines.  Tell your health care provider if:  You often feel depressed.  You have ever been abused or do not feel safe at home.  Summary  Adopting a healthy lifestyle and getting preventive care are important in promoting health and wellness.  Follow your health care provider's instructions about healthy diet, exercising, and getting tested or screened for diseases.  Follow your health care provider's instructions on monitoring your cholesterol and blood pressure.  This information is not intended to replace advice given to you by your health care provider. Make sure you discuss any questions you have with your health care provider.  Document Revised: 08/17/2020 Document Reviewed: 08/17/2020  Elsevier Patient Education  2024 [employer].

## 2024-04-03 NOTE — Progress Notes (Signed)
 "  Subjective:  Patient ID: Derrick Benitez, male    DOB: 04-Jan-1981  Age: 43 y.o. MRN: [id number]  CC: Annual Exam, Hypertension, Hyperlipidemia, and Diabetes   HPI Derrick Benitez presents for a CPX and f/up ---  Discussed the use of AI scribe software for clinical note transcription with the patient, who gave verbal consent to proceed.  History of Present Illness Derrick Benitez is a 43 year old male who presents with a persistent burning sensation during urination.  He has been experiencing a persistent burning sensation during urination, described as a 'light burning' that has not resolved. This symptom began after he was 'playing with himself.' The burning sensation is only present during urination. He last had sexual intercourse on Saturday.  He visited an urgent care center last Wednesday due to these symptoms, where blood work was performed. However, he was unable to understand the results or the instructions given due to a lack of an interpreter. He was advised to undergo a syphilis test, which added to his confusion.  No sore throat, mouth ulcers, fever, chills, night sweats, rash, swollen lymph nodes, abdominal pain, nausea, or vomiting. He reports blood in stool and gastrointestinal pressure yesterday, feeling hot without fever, and occasional weakness, dizziness, or lightheadedness when not eating. His appetite is fine.     Outpatient Medications Prior to Visit  Medication Sig Dispense Refill   Accu-Chek Softclix Lancets lancets 1 each by Other route 2 (two) times daily. Use as instructed 100 each 2   Blood Glucose Monitoring Suppl (ACCU-CHEK GUIDE ME) w/Device KIT 1 Act by Does not apply route 2 (two) times daily. 1 kit 2   Cholecalciferol (VITAMIN D3) 50 MCG (2000 UT) capsule Take 1 capsule (2,000 Units total) by mouth daily. 100 capsule 3   Continuous Glucose Sensor (FREESTYLE LIBRE 3 PLUS SENSOR) MISC Apply 1 Act topically every 14 (fourteen) days. Change sensor every 15 days. 6 each  1   Eluxadoline  (VIBERZI ) 75 MG TABS Take 1 tablet (75 mg total) by mouth 2 (two) times daily as needed. 180 tablet 0   emtricitabine-tenofovir AF (DESCOVY ) 200-25 MG tablet Take 1 tablet by mouth daily. 90 tablet 0   EPINEPHrine 0.3 mg/0.3 mL IJ SOAJ injection Inject 0.3 mg into the muscle as needed for anaphylaxis.     glucose blood (ACCU-CHEK GUIDE) test strip 1 each by Other route 2 (two) times daily. Use as instructed 100 each 2   Insulin  Pen Needle 32G X 6 MM MISC 1 Act by Does not apply route daily. 100 each 3   metFORMIN  (GLUCOPHAGE -XR) 750 MG 24 hr tablet TAKE 1 TABLET(750 MG) BY MOUTH DAILY WITH BREAKFAST 90 tablet 0   prednisoLONE  (PRELONE ) 15 MG/5ML SOLN Take 5 mLs (15 mg total) by mouth daily before breakfast. Swish and spit 60 mL 0   rosuvastatin  (CRESTOR ) 5 MG tablet TAKE 1 TABLET(5 MG) BY MOUTH DAILY 90 tablet 1   sildenafil  (VIAGRA ) 100 MG tablet Take 0.5-1 tablets (50-100 mg total) by mouth daily as needed for erectile dysfunction. 12 tablet 1   TOUJEO  MAX SOLOSTAR 300 UNIT/ML Solostar Pen ADMINISTER 20 UNITS UNDER THE SKIN DAILY 6 mL 0   Vitamin D , Ergocalciferol , (DRISDOL ) 1.25 MG (50000 UNIT) CAPS capsule Take 1 capsule (50,000 Units total) by mouth every 7 (seven) days. 8 capsule 0   vortioxetine  HBr (TRINTELLIX ) 10 MG TABS tablet Take 1 tablet (10 mg total) by mouth daily. 90 tablet 0   No facility-administered medications prior to visit.  ROS Review of Systems  Constitutional:  Negative for appetite change, chills, diaphoresis, fatigue and fever.  HENT: Negative.  Negative for mouth sores, sore throat, trouble swallowing and voice change.   Eyes: Negative.   Respiratory:  Negative for cough, chest tightness, shortness of breath and wheezing.   Cardiovascular:  Negative for chest pain, palpitations and leg swelling.  Gastrointestinal: Negative.  Negative for abdominal pain, constipation, diarrhea, nausea and vomiting.  Genitourinary:  Positive for dysuria. Negative  for genital sores, hematuria, penile discharge, penile pain, penile swelling, scrotal swelling, testicular pain and urgency.  Musculoskeletal: Negative.  Negative for arthralgias and myalgias.  Skin: Negative.  Negative for color change and rash.  Neurological: Negative.  Negative for dizziness, speech difficulty, weakness and light-headedness.  Hematological:  Negative for adenopathy. Does not bruise/bleed easily.  Psychiatric/Behavioral: Negative.  Negative for agitation.     Objective:  BP 122/82 (BP Location: Right Arm, Patient Position: Sitting, Cuff Size: Normal)   Pulse 100   Temp 97.6 F (36.4 C) (Temporal)   Resp 16   Ht 5' 10 (1.778 m)   Wt 202 lb (91.6 kg)   SpO2 97%   BMI 28.98 kg/m   BP Readings from Last 3 Encounters:  04/03/24 122/82  02/19/24 124/76  11/16/23 136/84    Wt Readings from Last 3 Encounters:  04/03/24 202 lb (91.6 kg)  02/19/24 207 lb (93.9 kg)  11/16/23 213 lb (96.6 kg)    Physical Exam Vitals reviewed. Exam conducted with a chaperone present Derrick Benitez, Int).  Constitutional:      Appearance: Normal appearance.  HENT:     Nose: Nose normal.     Mouth/Throat:     Mouth: Mucous membranes are moist.  Eyes:     General: No scleral icterus.    Conjunctiva/sclera: Conjunctivae normal.  Cardiovascular:     Rate and Rhythm: Normal rate and regular rhythm.     Heart sounds: No murmur heard.    No friction rub. No gallop.  Pulmonary:     Effort: Pulmonary effort is normal.     Breath sounds: No stridor. No wheezing, rhonchi or rales.  Abdominal:     General: Abdomen is flat.     Palpations: There is no mass.     Tenderness: There is no abdominal tenderness. There is no guarding.     Hernia: No hernia is present. There is no hernia in the left inguinal area or right inguinal area.  Genitourinary:    Pubic Area: No rash or pubic lice.      Penis: Normal and uncircumcised. No phimosis, paraphimosis, hypospadias, erythema, tenderness,  discharge, swelling or lesions.      Testes: Normal.        Right: Mass, tenderness or swelling not present.        Left: Mass, tenderness or swelling not present.     Epididymis:     Right: Normal. Not inflamed or enlarged. No mass or tenderness.     Left: Not inflamed or enlarged. No mass or tenderness.  Musculoskeletal:        General: Normal range of motion.     Cervical back: Neck supple.  Lymphadenopathy:     Cervical: No cervical adenopathy.     Lower Body: No right inguinal adenopathy. No left inguinal adenopathy.  Skin:    General: Skin is warm.     Coloration: Skin is not jaundiced.     Findings: No rash.  Neurological:     General: No  focal deficit present.     Mental Status: He is alert. Mental status is at baseline.  Psychiatric:        Mood and Affect: Mood normal.        Behavior: Behavior normal.     Lab Results  Component Value Date   WBC 4.8 02/19/2024   HGB 14.2 02/19/2024   HCT 41.3 02/19/2024   PLT 222.0 02/19/2024   GLUCOSE 144 (H) 02/19/2024   CHOL 91 08/14/2023   TRIG 130.0 08/14/2023   HDL 32.20 (L) 08/14/2023   LDLDIRECT 92.0 06/28/2022   LDLCALC 33 08/14/2023   ALT 19 04/03/2024   AST 14 04/03/2024   NA 140 02/19/2024   K 3.8 02/19/2024   CL 104 02/19/2024   CREATININE 0.90 02/19/2024   BUN 23 02/19/2024   CO2 28 02/19/2024   TSH 1.14 11/16/2023   PSA 1.75 04/03/2024   HGBA1C 5.3 04/03/2024   MICROALBUR 1.4 08/14/2023    MR CERVICAL SPINE WO CONTRAST Result Date: 06/10/2020  [facility] [address], [address] [place], [place] [address] [phone] NEUROIMAGING REPORT STUDY DATE: 06/10/2020 PATIENT NAME: Derrick Benitez DOB: 03/17/1981 MRN: [id number] EXAM: MRI of the cervical spine ORDERING CLINICIAN: Richard A. Sater, MD. PhD CLINICAL HISTORY: 43 year old man with an abnormal brain MRI, dysesthesias and neck pain COMPARISON FILMS: None TECHNIQUE: MRI of the cervical spine was obtained utilizing 3 mm sagittal slices from the  posterior fossa down to the T3-4 level with T1, T2 and inversion recovery views. In addition 4 mm axial slices from C2-3 down to T1-2 level were included with T2 and gradient echo views. CONTRAST: None IMAGING SITE: [facility], [address] [place], [place], [place] FINDINGS: :  On sagittal images, the spine is imaged from above the cervicomedullary junction to T2.   Visible brain appears normal.  Paravertebral soft tissue appears normal.  The spinal cord is of normal caliber and signal.    Minimal reversal of the cervical curvature..  There is no spondylolisthesis.   The vertebral bodies have normal signal.  The discs and interspaces were further evaluated on axial views from C2 to T1 as follows: C2 - C3:  The disc and interspace appear normal. C3 - C4: There is minimal disc bulging.  There is no foraminal narrowing or spinal stenosis.  No nerve root compression.  C4 - C5:  There is minimal disc bulging.  There is no foraminal narrowing or spinal stenosis.  No nerve root compression.  C5 - C6:  The disc and interspace appear normal. C6 - C7:  There is minimal disc bulging.  There is no foraminal narrowing or spinal stenosis.  No nerve root compression.  C7 - T1:  The disc and interspace appear normal.   This MRI of the cervical spine without contrast shows the following: 1.   The spinal cord appears normal.  There is no evidence of demyelination. 2.   Minimal disc degenerative changes at C3-C4 and C4-C5 and C6-C7 that does not cause spinal stenosis or nerve root compression. INTERPRETING PHYSICIAN: Richard A. Vear, MD, PhD, FAAN Certified in  Neuroimaging by [employer]    Assessment & Plan:   Primary hypertension- BP is well controlled.  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 -     RPR W/RFLX TO RPR TITER, TREPONEMAL AB, SCREEN AND DIAGNOSIS; Future  Urethritis -     Urinalysis, Routine w reflex microscopic; Future -     GC/Chlamydia Probe Amp;  Future  Type  2 diabetes mellitus without complication, without long-term current use of insulin  (HCC) -     Hemoglobin A1c; Future -     Hepatitis B surface antibody,quantitative; Future  Encounter for general adult medical examination with abnormal findings- Exam completed, labs reviewed, vaccines reviewed, cancer screenings addressed, pt ed material was given.  -     PSA; Future  Hyperlipidemia LDL goal <160 -     Hepatic function panel; Future  Other orders -     Rpr titer -     T PALLIDUM AB     Follow-up: Return in about 3 months (around 07/02/2024).  Debby Molt, MD "

## 2024-04-05 ENCOUNTER — Encounter: Payer: Self-pay | Admitting: *Deleted

## 2024-04-05 NOTE — Progress Notes (Signed)
 Derrick Benitez                                          MRN: [id number]   04/05/2024   The [facility] Quality Team Specialist reviewed this patient medical record for the purposes of chart review for care gap closure. The following were reviewed: abstraction for care gap closure-glycemic status assessment.    [facility] Quality Team

## 2024-04-06 LAB — GC/CHLAMYDIA PROBE AMP
Chlamydia trachomatis, NAA: NEGATIVE
Neisseria Gonorrhoeae by PCR: NEGATIVE

## 2024-04-08 NOTE — Telephone Encounter (Signed)
 Unable to reach the nurse. LMTRC

## 2024-04-09 ENCOUNTER — Ambulatory Visit: Payer: Self-pay | Admitting: Internal Medicine

## 2024-04-09 LAB — T PALLIDUM AB: T Pallidum Abs: POSITIVE — AB

## 2024-04-09 LAB — HEPATITIS B SURFACE ANTIBODY, QUANTITATIVE: Hep B S AB Quant (Post): 367 m[IU]/mL

## 2024-04-09 LAB — RPR TITER: RPR Titer: 1:32 {titer} — ABNORMAL HIGH

## 2024-04-09 LAB — SYPHILIS: RPR W/REFLEX TO RPR TITER AND TREPONEMAL ANTIBODIES, TRADITIONAL SCREENING AND DIAGNOSIS ALGORITHM: RPR Ser Ql: REACTIVE — AB

## 2024-04-09 NOTE — Telephone Encounter (Signed)
 Unable to reach the Nurse, [facility]. This is my 3rd and final attempt I will close the call at this time. If she returns my call please don't hesitate to reopen the call.

## 2024-04-27 ENCOUNTER — Other Ambulatory Visit: Payer: Self-pay | Admitting: Internal Medicine

## 2024-04-27 DIAGNOSIS — E119 Type 2 diabetes mellitus without complications: Secondary | ICD-10-CM

## 2024-05-04 ENCOUNTER — Other Ambulatory Visit: Payer: Self-pay | Admitting: Internal Medicine

## 2024-05-04 DIAGNOSIS — E119 Type 2 diabetes mellitus without complications: Secondary | ICD-10-CM

## 2024-05-10 ENCOUNTER — Other Ambulatory Visit: Payer: Self-pay | Admitting: Internal Medicine

## 2024-05-10 DIAGNOSIS — E119 Type 2 diabetes mellitus without complications: Secondary | ICD-10-CM

## 2024-05-13 ENCOUNTER — Other Ambulatory Visit: Payer: Self-pay | Admitting: Internal Medicine

## 2024-05-30 ENCOUNTER — Ambulatory Visit: Admitting: Endocrinology
